# Patient Record
Sex: Female | Born: 1950
Health system: Southern US, Community
[De-identification: ages and names within clinical notes are randomized; demographics above are authoritative.]

## PROBLEM LIST (undated history)

## (undated) DIAGNOSIS — E7800 Pure hypercholesterolemia, unspecified: Secondary | ICD-10-CM

## (undated) DIAGNOSIS — R51 Headache: Secondary | ICD-10-CM

## (undated) DIAGNOSIS — K219 Gastro-esophageal reflux disease without esophagitis: Secondary | ICD-10-CM

## (undated) DIAGNOSIS — I729 Aneurysm of unspecified site: Secondary | ICD-10-CM

## (undated) DIAGNOSIS — G250 Essential tremor: Secondary | ICD-10-CM

## (undated) DIAGNOSIS — R251 Tremor, unspecified: Secondary | ICD-10-CM

## (undated) DIAGNOSIS — F419 Anxiety disorder, unspecified: Secondary | ICD-10-CM

## (undated) HISTORY — PX: EYE SURGERY: SHX253

## (undated) HISTORY — DX: Gastro-esophageal reflux disease without esophagitis: K21.9

## (undated) HISTORY — PX: ESOPHAGOGASTRODUODENOSCOPY ENDOSCOPY: SHX5814

## (undated) HISTORY — DX: Pure hypercholesterolemia, unspecified: E78.00

## (undated) HISTORY — DX: Essential tremor: G25.0

## (undated) HISTORY — DX: Tremor, unspecified: R25.1

---

## 1995-08-02 DIAGNOSIS — I729 Aneurysm of unspecified site: Secondary | ICD-10-CM

## 1995-08-02 HISTORY — DX: Aneurysm of unspecified site: I72.9

## 2003-08-02 HISTORY — PX: BREAST BIOPSY: SHX20

## 2004-08-01 HISTORY — PX: COLONOSCOPY: SHX174

## 2008-11-21 ENCOUNTER — Ambulatory Visit (HOSPITAL_COMMUNITY): Admission: RE | Admit: 2008-11-21 | Discharge: 2008-11-21 | Payer: Self-pay | Admitting: Family Medicine

## 2010-01-29 ENCOUNTER — Ambulatory Visit (HOSPITAL_COMMUNITY): Admission: RE | Admit: 2010-01-29 | Discharge: 2010-01-29 | Payer: Self-pay | Admitting: Family Medicine

## 2010-12-31 ENCOUNTER — Other Ambulatory Visit (HOSPITAL_COMMUNITY): Payer: Self-pay | Admitting: Family Medicine

## 2010-12-31 DIAGNOSIS — Z139 Encounter for screening, unspecified: Secondary | ICD-10-CM

## 2011-02-03 ENCOUNTER — Telehealth: Payer: Self-pay

## 2011-02-03 NOTE — Telephone Encounter (Signed)
Pt does not wish to schedule colonoscopy at this time. Said she will call back later. She wants to find a time when businesses will be closed but the doctors will be working at the hospital.  Faxing note back to Ruffin at [facility] that pt is going to put off a little while.   PT'S BROTHER HAS COLON CANCER!

## 2011-02-03 NOTE — Telephone Encounter (Signed)
Pt is aware she needs OV first, brother has colon cancer.

## 2011-02-04 ENCOUNTER — Ambulatory Visit (HOSPITAL_COMMUNITY)
Admission: RE | Admit: 2011-02-04 | Discharge: 2011-02-04 | Disposition: A | Discharge status: Home / Self Care | Payer: PRIVATE HEALTH INSURANCE | Source: Ambulatory Visit | Attending: Family Medicine | Admitting: Family Medicine

## 2011-02-04 DIAGNOSIS — Z139 Encounter for screening, unspecified: Secondary | ICD-10-CM

## 2011-02-04 DIAGNOSIS — Z1231 Encounter for screening mammogram for malignant neoplasm of breast: Secondary | ICD-10-CM | POA: Insufficient documentation

## 2011-02-04 NOTE — Telephone Encounter (Signed)
[facility] mobile appt is 02/25/2011 @ 10:00 AM. Selena Batten is aware. I will call pt on Mon and triage and mail prep.

## 2011-02-04 NOTE — Telephone Encounter (Signed)
Called pt. Schedule TCS Feb 24 999-MOVIPREP day before, NO SPLIT DOSING. Start clear liquids around 3 PM.

## 2011-02-04 NOTE — Telephone Encounter (Signed)
Pt had a bro w/ colon CA age 60. TCS 6 years ago.

## 2011-02-10 NOTE — Telephone Encounter (Signed)
Gastroenterology Pre-Procedure Form  Request Date: 02/25/2011     Requesting Physician: Dr. Regino Schultze     PATIENT INFORMATION:  Kaitlyn Glass is a 60 y.o., female (DOB=08-Nov-1950).  PROCEDURE: Procedure(s) requested: colonoscopy Procedure Reason: family history of colon cancer  PATIENT REVIEW QUESTIONS: The patient reports the following:   1. Diabetes Melitis: no 2. Joint replacements in the past 12 months: no 3. Major health problems in the past 3 months: no 4. Has an artificial valve or MVP:no 5. Has been advised in past to take antibiotics in advance of a procedure like teeth cleaning: no}    MEDICATIONS & ALLERGIES:    Patient reports the following regarding taking any blood thinners:   Plavix? no Aspirin?no Coumadin?  no  Patient confirms/reports the following medications:  Current Outpatient Prescriptions  Medication Sig Dispense Refill  . acetaminophen (TYLENOL) 500 MG tablet Take 500 mg by mouth every 6 (six) hours as needed. ONE TABLET DAILY       . calcium carbonate (OS-CAL) 600 MG TABS Take 600 mg by mouth daily.        . montelukast (SINGULAIR) 10 MG tablet Take 10 mg by mouth at bedtime.        . Multiple Vitamin (MULTIVITAMIN) tablet Take 1 tablet by mouth daily.        . simvastatin (ZOCOR) 10 MG tablet Take 10 mg by mouth at bedtime.          Patient confirms/reports the following allergies:  Allergies  Allergen Reactions  . Codeine     Nausea  . Penicillins     HIVES AND SWELLING OF THE THROAT    Patient is appropriate to schedule for requested procedure(s): yes  AUTHORIZATION INFORMATION Primary Insurance: ,  ID #: ,  Group #:  Pre-Cert / Auth required Pre-Cert / Auth #:  Secondary Insurance: ,  ID #: ,  Group #:  Pre-Cert / Auth required Pre-Cert / Auth #:  No orders of the defined types were placed in this encounter.    SCHEDULE INFORMATION: Procedure has been scheduled as follows:  Date: 02/25/2011, Time: 10:00 AM  Location: [facility] Short Stay  This Gastroenterology Pre-Precedure Form is being routed to the following provider(s) for review: Jonette Eva, MD  Kim aware of appt. Rx and instructions mailed to pt.

## 2011-02-10 NOTE — Telephone Encounter (Signed)
noted 

## 2011-02-10 NOTE — Telephone Encounter (Signed)
Rx and instructions mailed to pt.  

## 2011-02-10 NOTE — Telephone Encounter (Signed)
Opened in error

## 2011-02-24 MED ORDER — SODIUM CHLORIDE 0.45 % IV SOLN
Freq: Once | INTRAVENOUS | Status: AC
  Administered 2011-02-25: 10:00:00 via INTRAVENOUS

## 2011-02-25 ENCOUNTER — Encounter: Payer: PRIVATE HEALTH INSURANCE | Admitting: Gastroenterology

## 2011-02-25 ENCOUNTER — Other Ambulatory Visit: Payer: Self-pay | Admitting: Gastroenterology

## 2011-02-25 ENCOUNTER — Ambulatory Visit (HOSPITAL_COMMUNITY)
Admission: RE | Admit: 2011-02-25 | Discharge: 2011-02-25 | Disposition: A | Discharge status: Home / Self Care | Payer: PRIVATE HEALTH INSURANCE | Source: Ambulatory Visit | Attending: Gastroenterology | Admitting: Gastroenterology

## 2011-02-25 ENCOUNTER — Encounter (HOSPITAL_COMMUNITY)
Admission: RE | Disposition: A | Discharge status: Home / Self Care | Payer: Self-pay | Source: Ambulatory Visit | Attending: Gastroenterology

## 2011-02-25 ENCOUNTER — Encounter (HOSPITAL_COMMUNITY): Payer: Self-pay | Admitting: *Deleted

## 2011-02-25 DIAGNOSIS — D126 Benign neoplasm of colon, unspecified: Secondary | ICD-10-CM

## 2011-02-25 DIAGNOSIS — Z800 Family history of malignant neoplasm of digestive organs: Secondary | ICD-10-CM

## 2011-02-25 DIAGNOSIS — Z1211 Encounter for screening for malignant neoplasm of colon: Secondary | ICD-10-CM | POA: Insufficient documentation

## 2011-02-25 DIAGNOSIS — K648 Other hemorrhoids: Secondary | ICD-10-CM

## 2011-02-25 DIAGNOSIS — D128 Benign neoplasm of rectum: Secondary | ICD-10-CM | POA: Insufficient documentation

## 2011-02-25 HISTORY — DX: Headache: R51

## 2011-02-25 HISTORY — DX: Aneurysm of unspecified site: I72.9

## 2011-02-25 HISTORY — PX: COLONOSCOPY: SHX5424

## 2011-02-25 HISTORY — DX: Anxiety disorder, unspecified: F41.9

## 2011-02-25 SURGERY — COLONOSCOPY
Anesthesia: Moderate Sedation

## 2011-02-25 MED ORDER — MIDAZOLAM HCL 5 MG/5ML IJ SOLN
INTRAMUSCULAR | Status: DC | PRN
  Administered 2011-02-25: 2 mg via INTRAVENOUS
  Administered 2011-02-25: 1 mg via INTRAVENOUS

## 2011-02-25 MED ORDER — MEPERIDINE HCL 100 MG/ML IJ SOLN
INTRAMUSCULAR | Status: DC | PRN
  Administered 2011-02-25: 50 mg via INTRAVENOUS
  Administered 2011-02-25: 25 mg via INTRAVENOUS

## 2011-02-25 MED ORDER — MIDAZOLAM HCL 5 MG/5ML IJ SOLN
INTRAMUSCULAR | Status: AC
  Filled 2011-02-25: qty 10

## 2011-02-25 MED ORDER — MEPERIDINE HCL 100 MG/ML IJ SOLN
INTRAMUSCULAR | Status: AC
  Filled 2011-02-25: qty 2

## 2011-02-25 NOTE — H&P (Signed)
  Primary Care Physician:  Kirk Ruths, MD Primary Gastroenterologist:  Dr. Darrick Penna  Pre-Procedure History & Physical: HPI:  Kaitlyn Glass is a 60 y.o. female is here for a screening colonoscopy.   Past Medical History  Diagnosis Date  . Anxiety   . Headache   . Aneurysm 1997    no surgery    Past Surgical History  Procedure Date  . Colonoscopy 2006    Family Hx Colon Cancer - Brother  . Breast biopsy 2005    negative for CA    Prior to Admission medications   Medication Sig Start Date End Date Taking? Authorizing Provider  Multiple Vitamin (MULTIVITAMIN) tablet Take 1 tablet by mouth daily.     Yes Historical Provider, MD  acetaminophen (TYLENOL) 500 MG tablet Take 500 mg by mouth every 6 (six) hours as needed. ONE TABLET DAILY     Historical Provider, MD  calcium carbonate (OS-CAL) 600 MG TABS Take 600 mg by mouth daily.      Historical Provider, MD  montelukast (SINGULAIR) 10 MG tablet Take 10 mg by mouth at bedtime.      Historical Provider, MD  simvastatin (ZOCOR) 10 MG tablet Take 10 mg by mouth at bedtime.      Historical Provider, MD    Allergies as of 02/04/2011  . (Not on File)    Family History  Problem Relation Age of Onset  . Colon cancer Brother > 60    History   Social History  . Marital Status: Married    Spouse Name: N/A    Number of Children: N/A  . Years of Education: N/A   Occupational History  . Not on file.   Social History Main Topics  . Smoking status: Never Smoker   . Smokeless tobacco: Not on file  . Alcohol Use: No  . Drug Use: No  . Sexually Active:    Other Topics Concern  . Not on file   Social History Narrative  . No narrative on file    Review of Systems: See HPI, otherwise negative ROS  Physical Exam: BP 130/76  Pulse 71  Temp 98.2 F (36.8 C)  Resp 16  Ht 5\' 3"  (1.6 m)  Wt 145 lb (65.772 kg)  BMI 25.69 kg/m2  SpO2 98% General:   Alert,  pleasant and cooperative in NAD Head:  Normocephalic and  atraumatic. Neck:  Supple; no masses or thyromegaly. Lungs:  Clear throughout to auscultation.    Heart:  Regular rate and rhythm. Abdomen:  Soft, nontender and nondistended. Normal bowel sounds, without guarding, and without rebound.   Neurologic:  Alert and  oriented x4;  grossly normal neurologically.  Impression/Plan: Kaitlyn Glass is now here to undergo a screening colonoscopy.  Risks, benefits, limitations, and alternatives regarding colonoscopy have been reviewed with the patient. Questions have been answered.

## 2011-03-01 ENCOUNTER — Telehealth: Payer: Self-pay | Admitting: Gastroenterology

## 2011-03-01 NOTE — Telephone Encounter (Signed)
Cc path to PCP 

## 2011-03-01 NOTE — Telephone Encounter (Signed)
Reminder in epic to repeat tcs in 5-10 yrs

## 2011-03-01 NOTE — Telephone Encounter (Signed)
Please call pt. She had HYPERPLASTIC POLYP removed from her colon. TCS in 5-10 years. High fiber diet.

## 2011-03-02 NOTE — Telephone Encounter (Signed)
Pt informed

## 2011-03-04 ENCOUNTER — Encounter (HOSPITAL_COMMUNITY): Payer: Self-pay | Admitting: Gastroenterology

## 2011-03-18 ENCOUNTER — Other Ambulatory Visit: Payer: Self-pay | Admitting: Obstetrics & Gynecology

## 2011-03-18 ENCOUNTER — Other Ambulatory Visit (HOSPITAL_COMMUNITY)
Admission: RE | Admit: 2011-03-18 | Discharge: 2011-03-18 | Disposition: A | Discharge status: Home / Self Care | Payer: PRIVATE HEALTH INSURANCE | Source: Ambulatory Visit | Attending: Obstetrics & Gynecology | Admitting: Obstetrics & Gynecology

## 2011-03-18 DIAGNOSIS — Z01419 Encounter for gynecological examination (general) (routine) without abnormal findings: Secondary | ICD-10-CM | POA: Insufficient documentation

## 2012-02-07 ENCOUNTER — Other Ambulatory Visit: Payer: Self-pay | Admitting: Obstetrics & Gynecology

## 2012-02-07 DIAGNOSIS — Z139 Encounter for screening, unspecified: Secondary | ICD-10-CM

## 2012-02-17 ENCOUNTER — Ambulatory Visit (HOSPITAL_COMMUNITY)
Admission: RE | Admit: 2012-02-17 | Discharge: 2012-02-17 | Disposition: A | Discharge status: Home / Self Care | Payer: PRIVATE HEALTH INSURANCE | Source: Ambulatory Visit | Attending: Family Medicine | Admitting: Family Medicine

## 2012-02-17 ENCOUNTER — Ambulatory Visit (HOSPITAL_COMMUNITY): Payer: PRIVATE HEALTH INSURANCE

## 2012-02-17 ENCOUNTER — Other Ambulatory Visit (HOSPITAL_COMMUNITY): Payer: Self-pay | Admitting: Family Medicine

## 2012-02-17 ENCOUNTER — Ambulatory Visit (HOSPITAL_COMMUNITY)
Admission: RE | Admit: 2012-02-17 | Discharge: 2012-02-17 | Disposition: A | Discharge status: Home / Self Care | Payer: PRIVATE HEALTH INSURANCE | Source: Ambulatory Visit | Attending: Obstetrics & Gynecology | Admitting: Obstetrics & Gynecology

## 2012-02-17 DIAGNOSIS — Z1231 Encounter for screening mammogram for malignant neoplasm of breast: Secondary | ICD-10-CM | POA: Insufficient documentation

## 2012-02-17 DIAGNOSIS — Z01419 Encounter for gynecological examination (general) (routine) without abnormal findings: Secondary | ICD-10-CM | POA: Insufficient documentation

## 2012-02-17 DIAGNOSIS — Z139 Encounter for screening, unspecified: Secondary | ICD-10-CM

## 2012-02-17 DIAGNOSIS — R05 Cough: Secondary | ICD-10-CM | POA: Insufficient documentation

## 2012-02-17 DIAGNOSIS — R059 Cough, unspecified: Secondary | ICD-10-CM | POA: Insufficient documentation

## 2012-03-23 ENCOUNTER — Other Ambulatory Visit (HOSPITAL_COMMUNITY)
Admission: RE | Admit: 2012-03-23 | Discharge: 2012-03-23 | Disposition: A | Discharge status: Home / Self Care | Payer: PRIVATE HEALTH INSURANCE | Source: Ambulatory Visit | Attending: Obstetrics & Gynecology | Admitting: Obstetrics & Gynecology

## 2012-03-23 ENCOUNTER — Other Ambulatory Visit: Payer: Self-pay | Admitting: Obstetrics & Gynecology

## 2012-03-23 DIAGNOSIS — Z01419 Encounter for gynecological examination (general) (routine) without abnormal findings: Secondary | ICD-10-CM | POA: Insufficient documentation

## 2013-01-14 ENCOUNTER — Other Ambulatory Visit (HOSPITAL_COMMUNITY): Payer: Self-pay | Admitting: Family Medicine

## 2013-01-14 DIAGNOSIS — Z139 Encounter for screening, unspecified: Secondary | ICD-10-CM

## 2013-02-22 ENCOUNTER — Ambulatory Visit (HOSPITAL_COMMUNITY)
Admission: RE | Admit: 2013-02-22 | Discharge: 2013-02-22 | Disposition: A | Discharge status: Home / Self Care | Payer: PRIVATE HEALTH INSURANCE | Source: Ambulatory Visit | Attending: Family Medicine | Admitting: Family Medicine

## 2013-02-22 DIAGNOSIS — Z139 Encounter for screening, unspecified: Secondary | ICD-10-CM

## 2013-02-22 DIAGNOSIS — Z1231 Encounter for screening mammogram for malignant neoplasm of breast: Secondary | ICD-10-CM | POA: Insufficient documentation

## 2013-03-29 ENCOUNTER — Other Ambulatory Visit (HOSPITAL_COMMUNITY)
Admission: RE | Admit: 2013-03-29 | Discharge: 2013-03-29 | Disposition: A | Discharge status: Home / Self Care | Payer: PRIVATE HEALTH INSURANCE | Source: Ambulatory Visit | Attending: Obstetrics & Gynecology | Admitting: Obstetrics & Gynecology

## 2013-03-29 ENCOUNTER — Ambulatory Visit (INDEPENDENT_AMBULATORY_CARE_PROVIDER_SITE_OTHER): Payer: PRIVATE HEALTH INSURANCE | Admitting: Obstetrics & Gynecology

## 2013-03-29 ENCOUNTER — Encounter: Payer: Self-pay | Admitting: Obstetrics & Gynecology

## 2013-03-29 VITALS — BP 140/70 | Ht 62.0 in | Wt 170.0 lb

## 2013-03-29 DIAGNOSIS — I471 Supraventricular tachycardia: Secondary | ICD-10-CM

## 2013-03-29 DIAGNOSIS — Z01419 Encounter for gynecological examination (general) (routine) without abnormal findings: Secondary | ICD-10-CM | POA: Insufficient documentation

## 2013-03-29 DIAGNOSIS — Z1151 Encounter for screening for human papillomavirus (HPV): Secondary | ICD-10-CM | POA: Insufficient documentation

## 2013-03-29 DIAGNOSIS — Z1212 Encounter for screening for malignant neoplasm of rectum: Secondary | ICD-10-CM

## 2013-03-29 DIAGNOSIS — G250 Essential tremor: Secondary | ICD-10-CM

## 2013-03-29 DIAGNOSIS — E781 Pure hyperglyceridemia: Secondary | ICD-10-CM

## 2013-03-29 NOTE — Patient Instructions (Addendum)
Tremor  Tremor is a rhythmic, involuntary muscular contraction characterized by oscillations (to-and-fro movements) of a part of the body. The most common of all involuntary movements, tremor can affect various body parts such as the hands, head, facial structures, vocal cords, trunk, and legs; most tremors, however, occur in the hands. Tremor often accompanies neurological disorders associated with aging. Although the disorder is not life-threatening, it can be responsible for functional disability and social embarrassment.  TREATMENT   There are many types of tremor and several ways in which tremor is classified. The most common classification is by behavioral context or position. There are five categories of tremor within this classification: resting, postural, kinetic, task-specific, and psychogenic. Resting or static tremor occurs when the muscle is at rest, for example when the hands are lying on the lap. This type of tremor is often seen in patients with Parkinson's disease. Postural tremor occurs when a patient attempts to maintain posture, such as holding the hands outstretched. Postural tremors include physiological tremor, essential tremor, tremor with basal ganglia disease (also seen in patients with Parkinson's disease), cerebellar postural tremor, tremor with peripheral neuropathy, post-traumatic tremor, and alcoholic tremor. Kinetic or intention (action) tremor occurs during purposeful movement, for example during finger-to-nose testing. Task-specific tremor appears when performing goal-oriented tasks such as handwriting, speaking, or standing. This group consists of primary writing tremor, vocal tremor, and orthostatic tremor. Psychogenic tremor occurs in both older and younger patients. The key feature of this tremor is that it dramatically lessens or disappears when the patient is distracted.  PROGNOSIS  There are some treatment options available for tremor; the appropriate treatment depends on  accurate diagnosis of the cause. Some tremors respond to treatment of the underlying condition, for example in some cases of psychogenic tremor treating the patient's underlying mental problem may cause the tremor to disappear. Also, patients with tremor due to Parkinson's disease may be treated with Levodopa drug therapy. Symptomatic drug therapy is available for several other tremors as well. For those cases of tremor in which there is no effective drug treatment, physical measures such as teaching the patient to brace the affected limb during the tremor are sometimes useful. Surgical intervention such as thalamotomy or deep brain stimulation may be useful in certain cases.  Document Released: 07/08/2002 Document Revised: 10/10/2011 Document Reviewed: 07/18/2005  Exit[employer]® Patient Information ©2014 [employer].

## 2013-03-29 NOTE — Progress Notes (Signed)
Patient ID: Kaitlyn Glass, female   DOB: [birth year], 62 y.o.   MRN: [id number] Subjective:     Kaitlyn Glass is a 62 y.o. female here for a routine exam.  No LMP recorded. Patient is postmenopausal. No obstetric history on file. Current complaints: none  Personal health questionnaire reviewed: no.   Gynecologic History No LMP recorded. Patient is postmenopausal. Contraception: post menopausal Last Pap: 2013. Results were: normal Last mammogram: 2014. Results were: normal  Obstetric History OB History  No data available     Past Medical History  Diagnosis Date  . Anxiety   . Headache(784.0)   . Aneurysm 1997    no surgery  . Benign familial tremor    Past Surgical History  Procedure Laterality Date  . Colonoscopy  2006    Family Hx Colon Cancer - Brother  . Breast biopsy  2005    negative for CA  . Colonoscopy  02/25/2011    Procedure: COLONOSCOPY;  Surgeon: Arlyce Harman, MD;  Location: [facility];  Service: Endoscopy;  Laterality: N/A;  . Eye surgery       Review of Systems  Review of Systems  Constitutional: Negative for fever, chills, weight loss, malaise/fatigue and diaphoresis.  HENT: Negative for hearing loss, ear pain, nosebleeds, congestion, sore throat, neck pain, tinnitus and ear discharge.   Eyes: Negative for blurred vision, double vision, photophobia, pain, discharge and redness.  Respiratory: Negative for cough, hemoptysis, sputum production, shortness of breath, wheezing and stridor.   Cardiovascular: Negative for chest pain, palpitations, orthopnea, claudication, leg swelling and PND.  Gastrointestinal: negative for abdominal pain. Negative for heartburn, nausea, vomiting, diarrhea, constipation, blood in stool and melena.  Genitourinary: Negative for dysuria, urgency, frequency, hematuria and flank pain.  Musculoskeletal: Negative for myalgias, back pain, joint pain and falls.  Skin: Negative for itching and rash.  Neurological: Negative for dizziness,  tingling, tremors, sensory change, speech change, focal weakness, seizures, loss of consciousness, weakness and headaches.  Endo/Heme/Allergies: Negative for environmental allergies and polydipsia. Does not bruise/bleed easily.  Psychiatric/Behavioral: Negative for depression, suicidal ideas, hallucinations, memory loss and substance abuse. The patient is not nervous/anxious and does not have insomnia.        Objective:    Physical Exam  Vitals reviewed. Constitutional: She is oriented to person, place, and time. She appears well-developed and well-nourished.  HENT:  Head: Normocephalic and atraumatic.        Right Ear: External ear normal.  Left Ear: External ear normal.  Nose: Nose normal.  Mouth/Throat: Oropharynx is clear and moist.  Eyes: Conjunctivae and EOM are normal. Pupils are equal, round, and reactive to light. Right eye exhibits no discharge. Left eye exhibits no discharge. No scleral icterus.  Neck: Normal range of motion. Neck supple. No tracheal deviation present. No thyromegaly present.  Cardiovascular: Normal rate, regular rhythm, normal heart sounds and intact distal pulses.  Exam reveals no gallop and no friction rub.   No murmur heard. Respiratory: Effort normal and breath sounds normal. No respiratory distress. She has no wheezes. She has no rales. She exhibits no tenderness.  GI: Soft. Bowel sounds are normal. She exhibits no distension and no mass. There is no tenderness. There is no rebound and no guarding.  Genitourinary:       Vulva is normal without lesions Vagina is pink moist without discharge Cervix normal in appearance and pap is done Uterus is normal size shape and contour Adnexa is negative with normal sized ovaries  Rectal heme negative no masses normal tone Musculoskeletal: Normal range of motion. She exhibits no edema and no tenderness.  Neurological: She is alert and oriented to person, place, and time. She has normal reflexes. She displays normal  reflexes. No cranial nerve deficit. She exhibits normal muscle tone. Coordination normal.  Skin: Skin is warm and dry. No rash noted. No erythema. No pallor.  Psychiatric: She has a normal mood and affect. Her behavior is normal. Judgment and thought content normal.       Assessment:    Healthy female exam.    Plan:  Pap done folow up 1 year

## 2014-02-11 ENCOUNTER — Other Ambulatory Visit: Payer: Self-pay | Admitting: Obstetrics & Gynecology

## 2014-02-11 DIAGNOSIS — Z1231 Encounter for screening mammogram for malignant neoplasm of breast: Secondary | ICD-10-CM

## 2014-02-28 ENCOUNTER — Ambulatory Visit (HOSPITAL_COMMUNITY): Payer: PRIVATE HEALTH INSURANCE

## 2014-03-07 ENCOUNTER — Ambulatory Visit (HOSPITAL_COMMUNITY): Payer: PRIVATE HEALTH INSURANCE

## 2014-03-14 ENCOUNTER — Ambulatory Visit (HOSPITAL_COMMUNITY)
Admission: RE | Admit: 2014-03-14 | Discharge: 2014-03-14 | Disposition: A | Discharge status: Home / Self Care | Payer: PRIVATE HEALTH INSURANCE | Source: Ambulatory Visit | Attending: Obstetrics & Gynecology | Admitting: Obstetrics & Gynecology

## 2014-03-14 DIAGNOSIS — Z1231 Encounter for screening mammogram for malignant neoplasm of breast: Secondary | ICD-10-CM

## 2014-03-26 ENCOUNTER — Telehealth: Payer: Self-pay | Admitting: Obstetrics & Gynecology

## 2014-03-26 NOTE — Telephone Encounter (Signed)
Kaitlyn Glass spoke with pt letting her know mammogram was normal. JSY

## 2014-04-18 ENCOUNTER — Ambulatory Visit (INDEPENDENT_AMBULATORY_CARE_PROVIDER_SITE_OTHER): Payer: PRIVATE HEALTH INSURANCE | Admitting: Obstetrics & Gynecology

## 2014-04-18 ENCOUNTER — Other Ambulatory Visit (HOSPITAL_COMMUNITY)
Admission: RE | Admit: 2014-04-18 | Discharge: 2014-04-18 | Disposition: A | Discharge status: Home / Self Care | Payer: PRIVATE HEALTH INSURANCE | Source: Ambulatory Visit | Attending: Obstetrics & Gynecology | Admitting: Obstetrics & Gynecology

## 2014-04-18 ENCOUNTER — Encounter: Payer: Self-pay | Admitting: Obstetrics & Gynecology

## 2014-04-18 VITALS — BP 148/80 | Ht 63.0 in | Wt 173.0 lb

## 2014-04-18 DIAGNOSIS — Z01419 Encounter for gynecological examination (general) (routine) without abnormal findings: Secondary | ICD-10-CM | POA: Diagnosis present

## 2014-04-18 NOTE — Progress Notes (Signed)
Patient ID: Kaitlyn Glass, female   DOB: July 14, 1951, 64 y.o.   MRN: [id number] Subjective:     Kaitlyn Glass is a 63 y.o. female here for a routine exam.  No LMP recorded. Patient is postmenopausal. No obstetric history on file. Birth Control Method:  Post menopausal Menstrual Calendar(currently): na  Current complaints: none.   Current acute medical issues:  na   Recent Gynecologic History No LMP recorded. Patient is postmenopausal. Last Pap: 2014,  normal Last mammogram:2015,  normal  Past Medical History  Diagnosis Date  . Anxiety   . Headache(784.0)   . Aneurysm 1997    no surgery  . Benign familial tremor     Past Surgical History  Procedure Laterality Date  . Colonoscopy  2006    Family Hx Colon Cancer - Brother  . Breast biopsy  2005    negative for CA  . Colonoscopy  02/25/2011    Procedure: COLONOSCOPY;  Surgeon: Dorothyann Peng, MD;  Location: [facility];  Service: Endoscopy;  Laterality: N/A;  . Eye surgery      OB History   Grav Para Term Preterm Abortions TAB SAB Ect Mult Living                  History   Social History  . Marital Status: Married    Spouse Name: N/A    Number of Children: N/A  . Years of Education: N/A   Social History Main Topics  . Smoking status: Never Smoker   . Smokeless tobacco: Never Used  . Alcohol Use: No  . Drug Use: No  . Sexual Activity: Yes   Other Topics Concern  . None   Social History Narrative  . None    Family History  Problem Relation Age of Onset  . Colon cancer Brother     age > 18  . Breast cancer Sister   . Throat cancer Brother      Review of Systems  Review of Systems  Constitutional: Negative for fever, chills, weight loss, malaise/fatigue and diaphoresis.  HENT: Negative for hearing loss, ear pain, nosebleeds, congestion, sore throat, neck pain, tinnitus and ear discharge.   Eyes: Negative for blurred vision, double vision, photophobia, pain, discharge and redness.  Respiratory: Negative  for cough, hemoptysis, sputum production, shortness of breath, wheezing and stridor.   Cardiovascular: Negative for chest pain, palpitations, orthopnea, claudication, leg swelling and PND.  Gastrointestinal: negative for abdominal pain. Negative for heartburn, nausea, vomiting, diarrhea, constipation, blood in stool and melena.  Genitourinary: Negative for dysuria, urgency, frequency, hematuria and flank pain.  Musculoskeletal: Negative for myalgias, back pain, joint pain and falls.  Skin: Negative for itching and rash.  Neurological: Negative for dizziness, tingling, tremors, sensory change, speech change, focal weakness, seizures, loss of consciousness, weakness and headaches.  Endo/Heme/Allergies: Negative for environmental allergies and polydipsia. Does not bruise/bleed easily.  Psychiatric/Behavioral: Negative for depression, suicidal ideas, hallucinations, memory loss and substance abuse. The patient is not nervous/anxious and does not have insomnia.        Objective:    Physical Exam  Vitals reviewed. Constitutional: She is oriented to person, place, and time. She appears well-developed and well-nourished.  HENT:  Head: Normocephalic and atraumatic.        Right Ear: External ear normal.  Left Ear: External ear normal.  Nose: Nose normal.  Mouth/Throat: Oropharynx is clear and moist.  Eyes: Conjunctivae and EOM are normal. Pupils are equal, round, and reactive to light.  Right eye exhibits no discharge. Left eye exhibits no discharge. No scleral icterus.  Neck: Normal range of motion. Neck supple. No tracheal deviation present. No thyromegaly present.  Cardiovascular: Normal rate, regular rhythm, normal heart sounds and intact distal pulses.  Exam reveals no gallop and no friction rub.   No murmur heard. Respiratory: Effort normal and breath sounds normal. No respiratory distress. She has no wheezes. She has no rales. She exhibits no tenderness.  GI: Soft. Bowel sounds are normal.  She exhibits no distension and no mass. There is no tenderness. There is no rebound and no guarding.  Genitourinary:  Breasts no masses skin changes or nipple changes bilaterally      Vulva is normal without lesions Vagina is pink moist without discharge Cervix normal in appearance and pap is done Uterus is normal size shape and contour Adnexa is negative with normal sized ovaries  Rectal    hemoccult negative, normal tone, no masses  Musculoskeletal: Normal range of motion. She exhibits no edema and no tenderness.  Neurological: She is alert and oriented to person, place, and time. She has normal reflexes. She displays normal reflexes. No cranial nerve deficit. She exhibits normal muscle tone. Coordination normal.  Skin: Skin is warm and dry. No rash noted. No erythema. No pallor.  Psychiatric: She has a normal mood and affect. Her behavior is normal. Judgment and thought content normal.       Assessment:    Healthy female exam.    Plan:    Follow up in: 1 year.   CoQ10 200 ng a day Red Krill Oil 1500 BID

## 2014-04-21 LAB — CYTOLOGY - PAP

## 2015-02-09 ENCOUNTER — Other Ambulatory Visit (HOSPITAL_COMMUNITY): Payer: Self-pay | Admitting: Internal Medicine

## 2015-02-09 DIAGNOSIS — Z1231 Encounter for screening mammogram for malignant neoplasm of breast: Secondary | ICD-10-CM

## 2015-03-20 ENCOUNTER — Ambulatory Visit (HOSPITAL_COMMUNITY)
Admission: RE | Admit: 2015-03-20 | Discharge: 2015-03-20 | Disposition: A | Discharge status: Home / Self Care | Payer: PRIVATE HEALTH INSURANCE | Source: Ambulatory Visit | Attending: Internal Medicine | Admitting: Internal Medicine

## 2015-03-20 DIAGNOSIS — Z1231 Encounter for screening mammogram for malignant neoplasm of breast: Secondary | ICD-10-CM | POA: Diagnosis not present

## 2015-04-22 ENCOUNTER — Other Ambulatory Visit (HOSPITAL_COMMUNITY)
Admission: RE | Admit: 2015-04-22 | Discharge: 2015-04-22 | Disposition: A | Discharge status: Home / Self Care | Payer: PRIVATE HEALTH INSURANCE | Source: Ambulatory Visit | Attending: Obstetrics & Gynecology | Admitting: Obstetrics & Gynecology

## 2015-04-22 ENCOUNTER — Other Ambulatory Visit: Payer: PRIVATE HEALTH INSURANCE | Admitting: Obstetrics & Gynecology

## 2015-04-22 ENCOUNTER — Encounter: Payer: Self-pay | Admitting: Obstetrics & Gynecology

## 2015-04-22 ENCOUNTER — Ambulatory Visit (INDEPENDENT_AMBULATORY_CARE_PROVIDER_SITE_OTHER): Payer: PRIVATE HEALTH INSURANCE | Admitting: Obstetrics & Gynecology

## 2015-04-22 VITALS — BP 120/80 | HR 80 | Wt 171.0 lb

## 2015-04-22 DIAGNOSIS — Z1212 Encounter for screening for malignant neoplasm of rectum: Secondary | ICD-10-CM

## 2015-04-22 DIAGNOSIS — Z01419 Encounter for gynecological examination (general) (routine) without abnormal findings: Secondary | ICD-10-CM

## 2015-04-22 DIAGNOSIS — Z1211 Encounter for screening for malignant neoplasm of colon: Secondary | ICD-10-CM

## 2015-04-22 NOTE — Progress Notes (Signed)
Patient ID: Kaitlyn Glass, female   DOB: Feb 06, 1951, 64 y.o.   MRN: [id number] Subjective:     Kaitlyn Glass is a 64 y.o. female here for a routine exam.  No LMP recorded. Patient is postmenopausal. No obstetric history on file. Birth Control Method:  none Menstrual Calendar(currently): post menopausal  Current complaints: none.   Current acute medical issues:  None, encouraged to take her nexium   Recent Gynecologic History No LMP recorded. Patient is postmenopausal. Last Pap: 2015,  normal Last mammogram: 8/16,  normal  Past Medical History  Diagnosis Date  . Anxiety   . Headache(784.0)   . Aneurysm 1997    no surgery  . Benign familial tremor     Past Surgical History  Procedure Laterality Date  . Colonoscopy  2006    Family Hx Colon Cancer - Brother  . Breast biopsy  2005    negative for CA  . Colonoscopy  02/25/2011    Procedure: COLONOSCOPY;  Surgeon: Dorothyann Peng, MD;  Location: [facility];  Service: Endoscopy;  Laterality: N/A;  . Eye surgery      OB History    No data available      Social History   Social History  . Marital Status: Married    Spouse Name: N/A  . Number of Children: N/A  . Years of Education: N/A   Social History Main Topics  . Smoking status: Never Smoker   . Smokeless tobacco: Never Used  . Alcohol Use: No  . Drug Use: No  . Sexual Activity: Yes   Other Topics Concern  . None   Social History Narrative    Family History  Problem Relation Age of Onset  . Colon cancer Brother     age > 71  . Breast cancer Sister   . Throat cancer Brother      Current outpatient prescriptions:  .  calcium carbonate (OS-CAL) 600 MG TABS, Take 600 mg by mouth daily.  , Disp: , Rfl:  .  simvastatin (ZOCOR) 10 MG tablet, Take 10 mg by mouth at bedtime.  , Disp: , Rfl:  .  acetaminophen (TYLENOL) 500 MG tablet, Take 500 mg by mouth every 6 (six) hours as needed. ONE TABLET DAILY , Disp: , Rfl:  .  ALPRAZolam (XANAX) 0.5 MG tablet, Take 0.5  mg by mouth at bedtime as needed. , Disp: , Rfl:  .  aspirin EC 81 MG tablet, Take 81 mg by mouth daily. , Disp: , Rfl:  .  esomeprazole (NEXIUM) 40 MG capsule, Take 40 mg by mouth daily. , Disp: , Rfl:  .  montelukast (SINGULAIR) 10 MG tablet, Take 10 mg by mouth at bedtime.  , Disp: , Rfl:  .  Multiple Vitamin (MULTIVITAMIN) tablet, Take 1 tablet by mouth daily.  , Disp: , Rfl:  .  propranolol (INDERAL) 10 MG tablet, Take 10 mg by mouth 3 (three) times daily., Disp: , Rfl:   Review of Systems  Review of Systems  Constitutional: Negative for fever, chills, weight loss, malaise/fatigue and diaphoresis.  HENT: Negative for hearing loss, ear pain, nosebleeds, congestion, sore throat, neck pain, tinnitus and ear discharge.   Eyes: Negative for blurred vision, double vision, photophobia, pain, discharge and redness.  Respiratory: Negative for cough, hemoptysis, sputum production, shortness of breath, wheezing and stridor.   Cardiovascular: Negative for chest pain, palpitations, orthopnea, claudication, leg swelling and PND.  Gastrointestinal: negative for abdominal pain. Negative for heartburn, nausea, vomiting, diarrhea,  constipation, blood in stool and melena.  Genitourinary: Negative for dysuria, urgency, frequency, hematuria and flank pain.  Musculoskeletal: Negative for myalgias, back pain, joint pain and falls.  Skin: Negative for itching and rash.  Neurological: Negative for dizziness, tingling, tremors, sensory change, speech change, focal weakness, seizures, loss of consciousness, weakness and headaches.  Endo/Heme/Allergies: Negative for environmental allergies and polydipsia. Does not bruise/bleed easily.  Psychiatric/Behavioral: Negative for depression, suicidal ideas, hallucinations, memory loss and substance abuse. The patient is not nervous/anxious and does not have insomnia.        Objective:  Blood pressure 120/80, pulse 80, weight 171 lb (77.565 kg).   Physical Exam  Vitals  reviewed. Constitutional: She is oriented to person, place, and time. She appears well-developed and well-nourished.  HENT:  Head: Normocephalic and atraumatic.        Right Ear: External ear normal.  Left Ear: External ear normal.  Nose: Nose normal.  Mouth/Throat: Oropharynx is clear and moist.  Eyes: Conjunctivae and EOM are normal. Pupils are equal, round, and reactive to light. Right eye exhibits no discharge. Left eye exhibits no discharge. No scleral icterus.  Neck: Normal range of motion. Neck supple. No tracheal deviation present. No thyromegaly present.  Cardiovascular: Normal rate, regular rhythm, normal heart sounds and intact distal pulses.  Exam reveals no gallop and no friction rub.   No murmur heard. Respiratory: Effort normal and breath sounds normal. No respiratory distress. She has no wheezes. She has no rales. She exhibits no tenderness.  GI: Soft. Bowel sounds are normal. She exhibits no distension and no mass. There is no tenderness. There is no rebound and no guarding.  Genitourinary:  Breasts no masses skin changes or nipple changes bilaterally      Vulva is normal without lesions Vagina is pink moist without discharge Cervix normal in appearance and pap is done Uterus is normal size shape and contour Adnexa is negative  {Rectal    hemoccult negative, normal tone, no masses  Musculoskeletal: Normal range of motion. She exhibits no edema and no tenderness.  Neurological: She is alert and oriented to person, place, and time. She has normal reflexes. She displays normal reflexes. No cranial nerve deficit. She exhibits normal muscle tone. Coordination normal.  Skin: Skin is warm and dry. No rash noted. No erythema. No pallor.  Psychiatric: She has a normal mood and affect. Her behavior is normal. Judgment and thought content normal.       Assessment:    Healthy female exam.    Plan:    Mammogram ordered. Follow up in: 1 year.

## 2015-04-24 ENCOUNTER — Other Ambulatory Visit: Payer: PRIVATE HEALTH INSURANCE | Admitting: Obstetrics & Gynecology

## 2015-04-28 LAB — CYTOLOGY - PAP

## 2016-01-07 ENCOUNTER — Encounter: Payer: Self-pay | Admitting: Gastroenterology

## 2016-01-12 ENCOUNTER — Other Ambulatory Visit: Payer: Self-pay | Admitting: Obstetrics & Gynecology

## 2016-01-12 DIAGNOSIS — Z1231 Encounter for screening mammogram for malignant neoplasm of breast: Secondary | ICD-10-CM

## 2016-03-21 ENCOUNTER — Ambulatory Visit (HOSPITAL_COMMUNITY)
Admission: RE | Admit: 2016-03-21 | Discharge: 2016-03-21 | Disposition: A | Discharge status: Home / Self Care | Payer: 59 | Source: Ambulatory Visit | Attending: Obstetrics & Gynecology | Admitting: Obstetrics & Gynecology

## 2016-03-21 DIAGNOSIS — Z1231 Encounter for screening mammogram for malignant neoplasm of breast: Secondary | ICD-10-CM | POA: Insufficient documentation

## 2016-04-26 ENCOUNTER — Encounter: Payer: Self-pay | Admitting: Obstetrics & Gynecology

## 2016-04-26 ENCOUNTER — Encounter (INDEPENDENT_AMBULATORY_CARE_PROVIDER_SITE_OTHER): Payer: Self-pay

## 2016-04-26 ENCOUNTER — Ambulatory Visit (INDEPENDENT_AMBULATORY_CARE_PROVIDER_SITE_OTHER): Payer: 59 | Admitting: Obstetrics & Gynecology

## 2016-04-26 VITALS — BP 130/80 | HR 70 | Wt 174.0 lb

## 2016-04-26 DIAGNOSIS — Z01419 Encounter for gynecological examination (general) (routine) without abnormal findings: Secondary | ICD-10-CM | POA: Diagnosis not present

## 2016-04-26 DIAGNOSIS — Z1212 Encounter for screening for malignant neoplasm of rectum: Secondary | ICD-10-CM

## 2016-04-26 DIAGNOSIS — Z1211 Encounter for screening for malignant neoplasm of colon: Secondary | ICD-10-CM | POA: Diagnosis not present

## 2016-04-26 NOTE — Progress Notes (Signed)
Subjective:     Kaitlyn Glass is a 65 y.o. female here for a routine exam.  No LMP recorded. Patient is postmenopausal. No obstetric history on file. Birth Control Method:  menopausal Menstrual Calendar(currently): amenorrhiec  Current complaints: none.   Current acute medical issues:  none   Recent Gynecologic History No LMP recorded. Patient is postmenopausal. Last Pap: 2016,  normal Last mammogram: 2017,  normal  Past Medical History:  Diagnosis Date  . Aneurysm ([place]) 1997   no surgery  . Anxiety   . Benign familial tremor   . [id number]     Past Surgical History:  Procedure Laterality Date  . BREAST BIOPSY  2005   negative for CA  . COLONOSCOPY  2006   Family Hx Colon Cancer - Brother  . COLONOSCOPY  02/25/2011   Procedure: COLONOSCOPY;  Surgeon: Dorothyann Peng, MD;  Location: [facility];  Service: Endoscopy;  Laterality: N/A;  . EYE SURGERY      OB History    No data available      Social History   Social History  . Marital status: Married    Spouse name: N/A  . Number of children: N/A  . Years of education: N/A   Social History Main Topics  . Smoking status: Never Smoker  . Smokeless tobacco: Never Used  . Alcohol use No  . Drug use: No  . Sexual activity: Yes   Other Topics Concern  . None   Social History Narrative  . None    Family History  Problem Relation Age of Onset  . Colon cancer Brother     age > 85  . Breast cancer Sister   . Throat cancer Brother      Current Outpatient Prescriptions:  .  acetaminophen (TYLENOL) 500 MG tablet, Take 500 mg by mouth every 6 (six) hours as needed. ONE TABLET DAILY , Disp: , Rfl:  .  calcium carbonate (OS-CAL) 600 MG TABS, Take 600 mg by mouth daily.  , Disp: , Rfl:  .  co-enzyme Q-10 30 MG capsule, Take 100 mg by mouth daily., Disp: , Rfl:  .  esomeprazole (NEXIUM) 40 MG capsule, Take 40 mg by mouth daily. , Disp: , Rfl:  .  Multiple Vitamin (MULTIVITAMIN) tablet, Take 1 tablet by mouth  daily.  , Disp: , Rfl:  .  propranolol (INDERAL) 10 MG tablet, Take 10 mg by mouth daily. , Disp: , Rfl:  .  simvastatin (ZOCOR) 10 MG tablet, Take 10 mg by mouth at bedtime.  , Disp: , Rfl:  .  ALPRAZolam (XANAX) 0.5 MG tablet, Take 0.5 mg by mouth at bedtime as needed. , Disp: , Rfl:  .  aspirin EC 81 MG tablet, Take 81 mg by mouth daily. , Disp: , Rfl:  .  montelukast (SINGULAIR) 10 MG tablet, Take 10 mg by mouth at bedtime.  , Disp: , Rfl:   Review of Systems  Review of Systems  Constitutional: Negative for fever, chills, weight loss, malaise/fatigue and diaphoresis.  HENT: Negative for hearing loss, ear pain, nosebleeds, congestion, sore throat, neck pain, tinnitus and ear discharge.   Eyes: Negative for blurred vision, double vision, photophobia, pain, discharge and redness.  Respiratory: Negative for cough, hemoptysis, sputum production, shortness of breath, wheezing and stridor.   Cardiovascular: Negative for chest pain, palpitations, orthopnea, claudication, leg swelling and PND.  Gastrointestinal: negative for abdominal pain. Negative for heartburn, nausea, vomiting, diarrhea, constipation, blood in stool and melena.  Genitourinary:  Negative for dysuria, urgency, frequency, hematuria and flank pain.  Musculoskeletal: Negative for myalgias, back pain, joint pain and falls.  Skin: Negative for itching and rash.  Neurological: Negative for dizziness, tingling, tremors, sensory change, speech change, focal weakness, seizures, loss of consciousness, weakness and headaches.  Endo/Heme/Allergies: Negative for environmental allergies and polydipsia. Does not bruise/bleed easily.  Psychiatric/Behavioral: Negative for depression, suicidal ideas, hallucinations, memory loss and substance abuse. The patient is not nervous/anxious and does not have insomnia.        Objective:  Blood pressure 130/80, pulse 70, weight 174 lb (78.9 kg).   Physical Exam  Vitals reviewed. Constitutional: She is  oriented to person, place, and time. She appears well-developed and well-nourished.  HENT:  Head: Normocephalic and atraumatic.        Right Ear: External ear normal.  Left Ear: External ear normal.  Nose: Nose normal.  Mouth/Throat: Oropharynx is clear and moist.  Eyes: Conjunctivae and EOM are normal. Pupils are equal, round, and reactive to light. Right eye exhibits no discharge. Left eye exhibits no discharge. No scleral icterus.  Neck: Normal range of motion. Neck supple. No tracheal deviation present. No thyromegaly present.  Cardiovascular: Normal rate, regular rhythm, normal heart sounds and intact distal pulses.  Exam reveals no gallop and no friction rub.   No murmur heard. Respiratory: Effort normal and breath sounds normal. No respiratory distress. She has no wheezes. She has no rales. She exhibits no tenderness.  GI: Soft. Bowel sounds are normal. She exhibits no distension and no mass. There is no tenderness. There is no rebound and no guarding.  Genitourinary:  Breasts no masses skin changes or nipple changes bilaterally      Vulva is normal without lesions Vagina is pink moist without discharge Cervix normal in appearance and pap is done Uterus is normal size shape and contour Adnexa is negative with normal sized ovaries  {Rectal    hemoccult negative, normal tone, no masses  Musculoskeletal: Normal range of motion. She exhibits no edema and no tenderness.  Neurological: She is alert and oriented to person, place, and time. She has normal reflexes. She displays normal reflexes. No cranial nerve deficit. She exhibits normal muscle tone. Coordination normal.  Skin: Skin is warm and dry. No rash noted. No erythema. No pallor.  Psychiatric: She has a normal mood and affect. Her behavior is normal. Judgment and thought content normal.       Medications Ordered at today's visit: Meds ordered this encounter  Medications  . co-enzyme Q-10 30 MG capsule    Sig: Take 100 mg by  mouth daily.    Other orders placed at today's visit: No orders of the defined types were placed in this encounter.     Assessment:    Healthy female exam.    Plan:    Mammogram ordered. Follow up in: 1 year.     Return in about 1 year (around 04/26/2017) for yearly, with Dr Elonda Husky.

## 2016-04-28 ENCOUNTER — Other Ambulatory Visit: Payer: PRIVATE HEALTH INSURANCE | Admitting: Obstetrics & Gynecology

## 2017-03-13 ENCOUNTER — Other Ambulatory Visit (HOSPITAL_COMMUNITY): Payer: Self-pay | Admitting: Family Medicine

## 2017-03-13 DIAGNOSIS — Z1231 Encounter for screening mammogram for malignant neoplasm of breast: Secondary | ICD-10-CM

## 2017-03-22 ENCOUNTER — Ambulatory Visit (HOSPITAL_COMMUNITY)
Admission: RE | Admit: 2017-03-22 | Discharge: 2017-03-22 | Disposition: A | Discharge status: Home / Self Care | Payer: 59 | Source: Ambulatory Visit | Attending: Family Medicine | Admitting: Family Medicine

## 2017-03-22 DIAGNOSIS — Z1231 Encounter for screening mammogram for malignant neoplasm of breast: Secondary | ICD-10-CM | POA: Insufficient documentation

## 2017-05-10 ENCOUNTER — Telehealth: Payer: Self-pay | Admitting: Gastroenterology

## 2017-05-10 NOTE — Telephone Encounter (Signed)
[phone] please call patient, she has questions about scheduling her colonoscopy.  Received letter last year and is wondering about waiting until she turns 20

## 2017-05-11 ENCOUNTER — Ambulatory Visit (INDEPENDENT_AMBULATORY_CARE_PROVIDER_SITE_OTHER): Payer: 59 | Admitting: Obstetrics & Gynecology

## 2017-05-11 ENCOUNTER — Other Ambulatory Visit (HOSPITAL_COMMUNITY)
Admission: RE | Admit: 2017-05-11 | Discharge: 2017-05-11 | Disposition: A | Discharge status: Home / Self Care | Payer: 59 | Source: Ambulatory Visit | Attending: Obstetrics & Gynecology | Admitting: Obstetrics & Gynecology

## 2017-05-11 ENCOUNTER — Encounter: Payer: Self-pay | Admitting: Obstetrics & Gynecology

## 2017-05-11 VITALS — BP 134/70 | HR 81 | Resp 18 | Ht 62.5 in | Wt 175.0 lb

## 2017-05-11 DIAGNOSIS — Z01419 Encounter for gynecological examination (general) (routine) without abnormal findings: Secondary | ICD-10-CM | POA: Insufficient documentation

## 2017-05-11 DIAGNOSIS — Z1211 Encounter for screening for malignant neoplasm of colon: Secondary | ICD-10-CM

## 2017-05-11 DIAGNOSIS — Z1212 Encounter for screening for malignant neoplasm of rectum: Secondary | ICD-10-CM

## 2017-05-11 LAB — HEMOCCULT GUIAC POC 1CARD (OFFICE): Fecal Occult Blood, POC: NEGATIVE

## 2017-05-11 NOTE — Addendum Note (Signed)
Addended by: Octaviano Glow on: 05/11/2017 11:50 AM   Modules accepted: Orders

## 2017-05-11 NOTE — Progress Notes (Signed)
Subjective:     Kaitlyn Glass is a 66 y.o. female here for a routine exam.  No LMP recorded. Patient is postmenopausal. G1P1 Birth Control Method:  Post menopausal Menstrual Calendar(currently): amenorrheic  Current complaints: none.   Current acute medical issues:  none   Recent Gynecologic History No LMP recorded. Patient is postmenopausal. Last Pap: 2016,  normal Last mammogram: 03/2017,  normal  Past Medical History:  Diagnosis Date  . Aneurysm ([place]) 1997   no surgery  . Anxiety   . Benign familial tremor   . QMGQQPYP(950.9)     Past Surgical History:  Procedure Laterality Date  . BREAST BIOPSY  2005   negative for CA  . COLONOSCOPY  2006   Family Hx Colon Cancer - Brother  . COLONOSCOPY  02/25/2011   Procedure: COLONOSCOPY;  Surgeon: Dorothyann Peng, MD;  Location[facility];  Service: Endoscopy;  Laterality: N/A;  . EYE SURGERY      OB History    Gravida Para Term Preterm AB Living   1 1       1    SAB TAB Ectopic Multiple Live Births                  Social History   Social History  . Marital status: Married    Spouse name: N/A  . Number of children: N/A  . Years of education: N/A   Social History Main Topics  . Smoking status: Never Smoker  . Smokeless tobacco: Never Used  . Alcohol use No  . Drug use: No  . Sexual activity: Yes    Birth control/ protection: None   Other Topics Concern  . None   Social History Narrative  . None    Family History  Problem Relation Age of Onset  . Colon cancer Brother        age > 50  . Breast cancer Sister   . Throat cancer Brother      Current Outpatient Prescriptions:  .  acetaminophen (TYLENOL) 500 MG tablet, Take 500 mg by mouth every 6 (six) hours as needed. ONE TABLET DAILY , Disp: , Rfl:  .  calcium carbonate (OS-CAL) 600 MG TABS, Take 600 mg by mouth daily.  , Disp: , Rfl:  .  co-enzyme Q-10 30 MG capsule, Take 100 mg by mouth daily., Disp: , Rfl:  .  esomeprazole (NEXIUM) 40 MG capsule, Take  40 mg by mouth daily. , Disp: , Rfl:  .  propranolol (INDERAL) 10 MG tablet, Take 10 mg by mouth daily. , Disp: , Rfl:  .  simvastatin (ZOCOR) 10 MG tablet, Take 10 mg by mouth at bedtime.  , Disp: , Rfl:  .  Vitamin D, Ergocalciferol, (DRISDOL) 50000 units CAPS capsule, Take 50,000 Units by mouth every 7 (seven) days., Disp: , Rfl:  .  aspirin EC 81 MG tablet, Take 81 mg by mouth daily. , Disp: , Rfl:  .  montelukast (SINGULAIR) 10 MG tablet, Take 10 mg by mouth at bedtime.  , Disp: , Rfl:  .  Multiple Vitamin (MULTIVITAMIN) tablet, Take 1 tablet by mouth daily.  , Disp: , Rfl:   Review of Systems  Review of Systems  Constitutional: Negative for fever, chills, weight loss, malaise/fatigue and diaphoresis.  HENT: Negative for hearing loss, ear pain, nosebleeds, congestion, sore throat, neck pain, tinnitus and ear discharge.   Eyes: Negative for blurred vision, double vision, photophobia, pain, discharge and redness.  Respiratory: Negative for cough,  hemoptysis, sputum production, shortness of breath, wheezing and stridor.   Cardiovascular: Negative for chest pain, palpitations, orthopnea, claudication, leg swelling and PND.  Gastrointestinal: negative for abdominal pain. Negative for heartburn, nausea, vomiting, diarrhea, constipation, blood in stool and melena.  Genitourinary: Negative for dysuria, urgency, frequency, hematuria and flank pain.  Musculoskeletal: Negative for myalgias, back pain, joint pain and falls.  Skin: Negative for itching and rash.  Neurological: Negative for dizziness, tingling, tremors, sensory change, speech change, focal weakness, seizures, loss of consciousness, weakness and headaches.  Endo/Heme/Allergies: Negative for environmental allergies and polydipsia. Does not bruise/bleed easily.  Psychiatric/Behavioral: Negative for depression, suicidal ideas, hallucinations, memory loss and substance abuse. The patient is not nervous/anxious and does not have insomnia.         Objective:  Blood pressure 134/70, pulse 81, resp. rate 18, height 5' 2.5" (1.588 m), weight 175 lb (79.4 kg).   Physical Exam  Vitals reviewed. Constitutional: She is oriented to person, place, and time. She appears well-developed and well-nourished.  HENT:  Head: Normocephalic and atraumatic.        Right Ear: External ear normal.  Left Ear: External ear normal.  Nose: Nose normal.  Mouth/Throat: Oropharynx is clear and moist.  Eyes: Conjunctivae and EOM are normal. Pupils are equal, round, and reactive to light. Right eye exhibits no discharge. Left eye exhibits no discharge. No scleral icterus.  Neck: Normal range of motion. Neck supple. No tracheal deviation present. No thyromegaly present.  Cardiovascular: Normal rate, regular rhythm, normal heart sounds and intact distal pulses.  Exam reveals no gallop and no friction rub.   No murmur heard. Respiratory: Effort normal and breath sounds normal. No respiratory distress. She has no wheezes. She has no rales. She exhibits no tenderness.  GI: Soft. Bowel sounds are normal. She exhibits no distension and no mass. There is no tenderness. There is no rebound and no guarding.  Genitourinary:  Breasts no masses skin changes or nipple changes bilaterally      Vulva is normal without lesions Vagina is pink moist without discharge Cervix normal in appearance and pap is done Uterus is normal size shape and contour Adnexa is negative with normal sized ovaries  {Rectal    hemoccult negative, normal tone, no masses  Musculoskeletal: Normal range of motion. She exhibits no edema and no tenderness.  Neurological: She is alert and oriented to person, place, and time. She has normal reflexes. She displays normal reflexes. No cranial nerve deficit. She exhibits normal muscle tone. Coordination normal.  Skin: Skin is warm and dry. No rash noted. No erythema. No pallor.  Psychiatric: She has a normal mood and affect. Her behavior is normal.  Judgment and thought content normal.       Medications Ordered at today's visit: Meds ordered this encounter  Medications  . Vitamin D, Ergocalciferol, (DRISDOL) 50000 units CAPS capsule    Sig: Take 50,000 Units by mouth every 7 (seven) days.    Other orders placed at today's visit: No orders of the defined types were placed in this encounter.     Assessment:    Healthy female exam.    Plan:    Mammogram ordered. Follow up in: 3 years.     Return in about 3 years (around 05/11/2020) for yearly, with Dr Elonda Husky.

## 2017-05-15 LAB — CYTOLOGY - PAP
Diagnosis: NEGATIVE
HPV: NOT DETECTED

## 2017-05-17 NOTE — Telephone Encounter (Signed)
Pt has questions since she will be going on Medicare soon. She is going to check it out and let me know if she needs to be scheduled before the end of the year.  I told her to make sure she calls right away to make sure she gets in before  The end of the year.

## 2017-09-08 DIAGNOSIS — H25812 Combined forms of age-related cataract, left eye: Secondary | ICD-10-CM | POA: Diagnosis not present

## 2017-09-08 DIAGNOSIS — Z961 Presence of intraocular lens: Secondary | ICD-10-CM | POA: Diagnosis not present

## 2017-11-27 ENCOUNTER — Telehealth: Payer: Self-pay

## 2017-11-27 NOTE — Telephone Encounter (Signed)
Please schedule nurse visit

## 2017-11-27 NOTE — Telephone Encounter (Signed)
PATIENT SCHEDULED AND LETTER SENT  °

## 2017-11-27 NOTE — Telephone Encounter (Signed)
Pt left VM that she was calling to seen when she needs to schedule her next colonoscopy. She has family hx of colon cancer ( her brother).  She was mailed a letter reminder to call in 01/07/2016 to schedule. I called her and she is not having any problems, just wants to update colonoscopy. Forwarding to [employer].

## 2017-12-26 ENCOUNTER — Encounter: Payer: Self-pay | Admitting: Gastroenterology

## 2017-12-26 ENCOUNTER — Other Ambulatory Visit: Payer: Self-pay

## 2017-12-26 ENCOUNTER — Ambulatory Visit (INDEPENDENT_AMBULATORY_CARE_PROVIDER_SITE_OTHER): Payer: Self-pay

## 2017-12-26 ENCOUNTER — Ambulatory Visit (INDEPENDENT_AMBULATORY_CARE_PROVIDER_SITE_OTHER): Payer: Medicare Other | Admitting: Gastroenterology

## 2017-12-26 VITALS — BP 134/78 | HR 84 | Temp 97.6°F | Ht 62.0 in | Wt 176.2 lb

## 2017-12-26 DIAGNOSIS — Z800 Family history of malignant neoplasm of digestive organs: Secondary | ICD-10-CM | POA: Insufficient documentation

## 2017-12-26 DIAGNOSIS — R1310 Dysphagia, unspecified: Secondary | ICD-10-CM

## 2017-12-26 DIAGNOSIS — Z1211 Encounter for screening for malignant neoplasm of colon: Secondary | ICD-10-CM

## 2017-12-26 MED ORDER — PEG 3350-KCL-NA BICARB-NACL 420 G PO SOLR: 4000.0000 mL | ORAL | 0 refills | Status: DC

## 2017-12-26 NOTE — Patient Instructions (Signed)
We have scheduled you for a colonoscopy, upper endoscopy, and dilation with Dr. Oneida Alar in the near future.  We can decide on best reflux medication and dosing pending the results of the upper endoscopy.  Further recommendations to follow!  It was a pleasure to see you today. I strive to create trusting relationships with patients to provide genuine, compassionate, and quality care. I value your feedback. If you receive a survey regarding your visit,  I greatly appreciate you taking time to fill this out.   Annitta Needs, PhD, ANP-BC [facility]

## 2017-12-26 NOTE — Progress Notes (Signed)
Pt came in for a nurse visit. She said she has a chronic cough and gets choked sometimes while she is eating. She stated her pcp wanted her to have an EGD done when she had her tcs done. She had multiple questions about it. I advised her that I would only be able to schedule her tcs and she would need to see a provider. She understands this and wanted to see someone. Pt lives almost an hour away and I have put her on for an ov today with AB at 1:30.

## 2017-12-26 NOTE — Progress Notes (Signed)
Gastroenterology Pre-Procedure Review  Request Date:12/26/17 Requesting Physician: SLF recall ( last tcs 02/25/11 hyperplastic polyp- brother had CRC)  PATIENT REVIEW QUESTIONS: The patient responded to the following health history questions as indicated:    1. Diabetes Melitis: no 2. Joint replacements in the past 12 months: no 3. Major health problems in the past 3 months: no 4. Has an artificial valve or MVP: no 5. Has a defibrillator: no 6. Has been advised in past to take antibiotics in advance of a procedure like teeth cleaning: no 7. Family history of colon cancer: yes (brother age 50)  38. Alcohol Use: no 9. History of sleep apnea: no  10. History of coronary artery or other vascular stents placed within the last 12 months: no 11. History of any prior anesthesia complications: no    MEDICATIONS & ALLERGIES:    Patient reports the following regarding taking any blood thinners:   Plavix? no Aspirin? no Coumadin? no Brilinta? no Xarelto? no Eliquis? no Pradaxa? no Savaysa? no Effient? no  Patient confirms/reports the following medications:  Current Outpatient Medications  Medication Sig Dispense Refill  . acetaminophen (TYLENOL) 500 MG tablet Take 500 mg by mouth every 6 (six) hours as needed. ONE TABLET DAILY     . calcium carbonate (OS-CAL) 600 MG TABS Take 600 mg by mouth daily.      . Cholecalciferol (VITAMIN D3) 2000 units TABS Take by mouth daily.    Marland Kitchen co-enzyme Q-10 30 MG capsule Take 100 mg by mouth daily.    Marland Kitchen esomeprazole (NEXIUM) 40 MG capsule Take 40 mg by mouth as needed.     . propranolol (INDERAL) 10 MG tablet Take 10 mg by mouth daily.     . simvastatin (ZOCOR) 10 MG tablet Take 10 mg by mouth at bedtime.       No current facility-administered medications for this visit.     Patient confirms/reports the following allergies:  Allergies  Allergen Reactions  . Codeine     Nausea  . Penicillins     HIVES AND SWELLING OF THE THROAT    No orders of  the defined types were placed in this encounter.   AUTHORIZATION INFORMATION  SCHEDULE INFORMATION: Procedure has been scheduled as follows:  Date:  Time:  Location:   This Gastroenterology Pre-Precedure Review Form is being routed to the following provider(s):

## 2017-12-26 NOTE — Progress Notes (Addendum)
REVIEWED-NO ADDITIONAL RECOMMENDATIONS.  Primary Care Physician:  Redmond School, MD Primary Gastroenterologist:  Dr. Oneida Alar   Chief Complaint  Patient presents with  . Dysphagia    trouble with food feeling like it is getting stuck. C/o cough. Was told by PCP needs EGD at time of TCS (not scheduled yet)    HPI:   Kaitlyn Glass is a 67 y.o. female presenting today at the request of Dr. Gerarda Fraction due to need for surveillance colonoscopy and consideration of EGD. She has a family history of colon cancer, with brother diagnosed age 68. Her last colonoscopy was in 2012 with sessile hyperplastic rectal polyp, internal hemorrhoids. Colonoscopy due 5-10 years.   She notes a chronic cough. Changed filter in house. Tried inhaler but not able to tolerate. Has taken Singulair. Nexium only as needed. Originally had been taking Nexium daily in the past when seen by Dr. Orson Ape, but it didn't help with reflux. Usually will take Nexium if feels symptomatic but not on an empty stomach. Notes solid food dysphagia a little over a year. Goes down in a few seconds but feels like she can't breathe when it happens. Denies hoarseness.   EGD about 30 years ago.   Past Medical History:  Diagnosis Date  . Aneurysm ([place]) 1997   no surgery  . Anxiety   . Benign familial tremor   . GERD (gastroesophageal reflux disease)   . Headache(784.0)   . Hypercholesterolemia     Past Surgical History:  Procedure Laterality Date  . BREAST BIOPSY  2005   negative for CA  . COLONOSCOPY  2006   Family Hx Colon Cancer - Brother  . COLONOSCOPY  02/25/2011   Dr. Oneida Alar: sessile hyperplastic rectal polyp, surveillance in 5-10 years  . EYE SURGERY      Current Outpatient Medications  Medication Sig Dispense Refill  . acetaminophen (TYLENOL) 500 MG tablet Take 500 mg by mouth every 6 (six) hours as needed. ONE TABLET DAILY     . calcium carbonate (OS-CAL) 600 MG TABS Take 600 mg by mouth daily.      . Cholecalciferol  (VITAMIN D3) 2000 units TABS Take by mouth daily.    Marland Kitchen co-enzyme Q-10 30 MG capsule Take 100 mg by mouth daily.    Marland Kitchen esomeprazole (NEXIUM) 40 MG capsule Take 40 mg by mouth as needed.     . propranolol (INDERAL) 10 MG tablet Take 10 mg by mouth daily.     . simvastatin (ZOCOR) 10 MG tablet Take 10 mg by mouth at bedtime.      . polyethylene glycol-electrolytes (TRILYTE) 420 g solution Take 4,000 mLs by mouth as directed. 4000 mL 0   No current facility-administered medications for this visit.     Allergies as of 12/26/2017 - Review Complete 12/26/2017  Allergen Reaction Noted  . Codeine  02/10/2011  . Penicillins  02/10/2011    Family History  Problem Relation Age of Onset  . Colon cancer Brother        age >  32 (diagnosed at age 45)  . Breast cancer Sister   . Throat cancer Brother     Social History   Socioeconomic History  . Marital status: Married    Spouse name: Not on file  . Number of children: Not on file  . Years of education: Not on file  . Highest education level: Not on file  Occupational History  . Not on file  Social Needs  . Financial resource strain:  Not on file  . Food insecurity:    Worry: Not on file    Inability: Not on file  . Transportation needs:    Medical: Not on file    Non-medical: Not on file  Tobacco Use  . Smoking status: Never Smoker  . Smokeless tobacco: Never Used  Substance and Sexual Activity  . Alcohol use: No  . Drug use: No  . Sexual activity: Yes    Birth control/protection: None  Lifestyle  . Physical activity:    Days per week: Not on file    Minutes per session: Not on file  . Stress: Not on file  Relationships  . Social connections:    Talks on phone: Not on file    Gets together: Not on file    Attends religious service: Not on file    Active member of club or organization: Not on file    Attends meetings of clubs or organizations: Not on file    Relationship status: Not on file  . Intimate partner violence:      Fear of current or ex partner: Not on file    Emotionally abused: Not on file    Physically abused: Not on file    Forced sexual activity: Not on file  Other Topics Concern  . Not on file  Social History Narrative  . Not on file    Review of Systems: Gen: Denies any fever, chills, fatigue, weight loss, lack of appetite.  CV: Denies chest pain, heart palpitations, peripheral edema, syncope.  Resp: Denies shortness of breath at rest or with exertion. Denies wheezing or cough.  GI: see HPI  GU : Denies urinary burning, urinary frequency, urinary hesitancy MS: Denies joint pain, muscle weakness, cramps, or limitation of movement.  Derm: Denies rash, itching, dry skin Psych: Denies depression, anxiety, memory loss, and confusion Heme: Denies bruising, bleeding, and enlarged lymph nodes.  Physical Exam: BP 134/78   Pulse 84   Temp 97.6 F (36.4 C) (Oral)   Ht 5\' 2"  (1.575 m)   Wt 176 lb 3.2 oz (79.9 kg)   BMI 32.23 kg/m  General:   Alert and oriented. Pleasant and cooperative. Well-nourished and well-developed.  Head:  Normocephalic and atraumatic. Eyes:  Without icterus, sclera clear and conjunctiva pink.  Ears:  Normal auditory acuity. Nose:  No deformity, discharge,  or lesions. Mouth:  No deformity or lesions, oral mucosa pink.  Lungs:  Clear to auscultation bilaterally. No wheezes, rales, or rhonchi. No distress.  Heart:  S1, S2 present without murmurs appreciated.  Abdomen:  +BS, soft, non-tender and non-distended. No HSM noted. No guarding or rebound. No masses appreciated.  Rectal:  Deferred  Msk:  Symmetrical without gross deformities. Normal posture. Extremities:  Without  edema. Neurologic:  Alert and  oriented x4 Psych:  Alert and cooperative. Normal mood and affect.

## 2017-12-31 NOTE — Assessment & Plan Note (Signed)
Solid food dysphagia for approximately one year. Nexium prn for GERD symptoms. Last EGD 30 years ago. Will pursue EGD at time of colonoscopy. Discussed likelihood of needing to take daily PPI: await EGD findings.  Proceed with upper endoscopy/dilation in the near future with Dr. Oneida Alar. The risks, benefits, and alternatives have been discussed in detail with patient. They have stated understanding and desire to proceed.

## 2017-12-31 NOTE — Assessment & Plan Note (Signed)
67 year old female with family history of colon cancer in brother (age greater than 39), with last colonoscopy in 2012 and due for colonoscopy 5-10 years. No concerning lower GI symptoms.   Proceed with colonoscopy with Dr. Oneida Alar in the near future. The risks, benefits, and alternatives have been discussed in detail with the patient. They state understanding and desire to proceed.

## 2018-01-01 NOTE — Progress Notes (Signed)
cc'ed to pcp °

## 2018-01-31 DIAGNOSIS — Z1389 Encounter for screening for other disorder: Secondary | ICD-10-CM | POA: Diagnosis not present

## 2018-01-31 DIAGNOSIS — J040 Acute laryngitis: Secondary | ICD-10-CM | POA: Diagnosis not present

## 2018-01-31 DIAGNOSIS — Z6831 Body mass index (BMI) 31.0-31.9, adult: Secondary | ICD-10-CM | POA: Diagnosis not present

## 2018-01-31 DIAGNOSIS — J22 Unspecified acute lower respiratory infection: Secondary | ICD-10-CM | POA: Diagnosis not present

## 2018-01-31 DIAGNOSIS — F419 Anxiety disorder, unspecified: Secondary | ICD-10-CM | POA: Diagnosis not present

## 2018-01-31 DIAGNOSIS — E6609 Other obesity due to excess calories: Secondary | ICD-10-CM | POA: Diagnosis not present

## 2018-02-19 ENCOUNTER — Encounter (HOSPITAL_COMMUNITY)
Admission: RE | Disposition: A | Discharge status: Home / Self Care | Payer: Self-pay | Source: Ambulatory Visit | Attending: Gastroenterology

## 2018-02-19 ENCOUNTER — Ambulatory Visit (HOSPITAL_COMMUNITY)
Admission: RE | Admit: 2018-02-19 | Discharge: 2018-02-19 | Disposition: A | Discharge status: Home / Self Care | Payer: Medicare Other | Source: Ambulatory Visit | Attending: Gastroenterology | Admitting: Gastroenterology

## 2018-02-19 ENCOUNTER — Encounter (HOSPITAL_COMMUNITY): Payer: Self-pay

## 2018-02-19 ENCOUNTER — Other Ambulatory Visit: Payer: Self-pay

## 2018-02-19 DIAGNOSIS — Z79899 Other long term (current) drug therapy: Secondary | ICD-10-CM | POA: Diagnosis not present

## 2018-02-19 DIAGNOSIS — G250 Essential tremor: Secondary | ICD-10-CM | POA: Diagnosis not present

## 2018-02-19 DIAGNOSIS — K644 Residual hemorrhoidal skin tags: Secondary | ICD-10-CM | POA: Insufficient documentation

## 2018-02-19 DIAGNOSIS — F419 Anxiety disorder, unspecified: Secondary | ICD-10-CM | POA: Diagnosis not present

## 2018-02-19 DIAGNOSIS — Z1211 Encounter for screening for malignant neoplasm of colon: Secondary | ICD-10-CM

## 2018-02-19 DIAGNOSIS — Z880 Allergy status to penicillin: Secondary | ICD-10-CM | POA: Insufficient documentation

## 2018-02-19 DIAGNOSIS — K3189 Other diseases of stomach and duodenum: Secondary | ICD-10-CM | POA: Diagnosis not present

## 2018-02-19 DIAGNOSIS — Z885 Allergy status to narcotic agent status: Secondary | ICD-10-CM | POA: Insufficient documentation

## 2018-02-19 DIAGNOSIS — Z86010 Personal history of colonic polyps: Secondary | ICD-10-CM | POA: Insufficient documentation

## 2018-02-19 DIAGNOSIS — Z808 Family history of malignant neoplasm of other organs or systems: Secondary | ICD-10-CM | POA: Diagnosis not present

## 2018-02-19 DIAGNOSIS — R1310 Dysphagia, unspecified: Secondary | ICD-10-CM

## 2018-02-19 DIAGNOSIS — K219 Gastro-esophageal reflux disease without esophagitis: Secondary | ICD-10-CM | POA: Diagnosis not present

## 2018-02-19 DIAGNOSIS — K317 Polyp of stomach and duodenum: Secondary | ICD-10-CM | POA: Diagnosis not present

## 2018-02-19 DIAGNOSIS — Z800 Family history of malignant neoplasm of digestive organs: Secondary | ICD-10-CM | POA: Diagnosis not present

## 2018-02-19 DIAGNOSIS — Q438 Other specified congenital malformations of intestine: Secondary | ICD-10-CM | POA: Insufficient documentation

## 2018-02-19 DIAGNOSIS — E7800 Pure hypercholesterolemia, unspecified: Secondary | ICD-10-CM | POA: Insufficient documentation

## 2018-02-19 HISTORY — PX: ESOPHAGOGASTRODUODENOSCOPY: SHX5428

## 2018-02-19 HISTORY — PX: COLONOSCOPY: SHX5424

## 2018-02-19 HISTORY — PX: SAVORY DILATION: SHX5439

## 2018-02-19 HISTORY — PX: BIOPSY: SHX5522

## 2018-02-19 SURGERY — COLONOSCOPY
Anesthesia: Moderate Sedation

## 2018-02-19 MED ORDER — MEPERIDINE HCL 100 MG/ML IJ SOLN
INTRAMUSCULAR | Status: AC
  Filled 2018-02-19: qty 2

## 2018-02-19 MED ORDER — SODIUM CHLORIDE 0.9 % IV SOLN
INTRAVENOUS | Status: DC
  Administered 2018-02-19: 11:00:00 via INTRAVENOUS

## 2018-02-19 MED ORDER — LIDOCAINE VISCOUS HCL 2 % MT SOLN
OROMUCOSAL | Status: DC | PRN
  Administered 2018-02-19: 1 via OROMUCOSAL

## 2018-02-19 MED ORDER — LIDOCAINE VISCOUS HCL 2 % MT SOLN
OROMUCOSAL | Status: AC
  Filled 2018-02-19: qty 15

## 2018-02-19 MED ORDER — MEPERIDINE HCL 100 MG/ML IJ SOLN
INTRAMUSCULAR | Status: DC | PRN
  Administered 2018-02-19 (×4): 25 mg via INTRAVENOUS

## 2018-02-19 MED ORDER — MIDAZOLAM HCL 5 MG/5ML IJ SOLN
INTRAMUSCULAR | Status: DC | PRN
  Administered 2018-02-19: 1 mg via INTRAVENOUS
  Administered 2018-02-19: 2 mg via INTRAVENOUS
  Administered 2018-02-19: 1 mg via INTRAVENOUS
  Administered 2018-02-19: 2 mg via INTRAVENOUS

## 2018-02-19 MED ORDER — MIDAZOLAM HCL 5 MG/5ML IJ SOLN
INTRAMUSCULAR | Status: AC
  Filled 2018-02-19: qty 10

## 2018-02-19 MED ORDER — MINERAL OIL PO OIL
TOPICAL_OIL | ORAL | Status: AC
  Filled 2018-02-19: qty 30

## 2018-02-19 MED ORDER — STERILE WATER FOR IRRIGATION IR SOLN
Status: DC | PRN
  Administered 2018-02-19: 13:00:00

## 2018-02-19 NOTE — Op Note (Signed)
[facility] Patient Name: Kaitlyn Glass Procedure Date: 02/19/2018 1:39 PM MRN: [id number] Date of Birth: 07/01/1951 Attending MD: Barney Drain MD, MD CSN: [id number] Age: 67 Admit Type: Outpatient Procedure:                Upper GI endoscopy WITH ESOPHAGEAL DILATION/COLD                            FORCEPS BIOPSY Indications:              Dysphagia W/ GERD SX < 1-2X/WEEK. FLARES OCCUR                            AFTER DIETARY NON-ADHERENCE. WEIGHT IN JUL 2012-145                            LBS AND NOW IN 2019 170 LBS. Providers:                Barney Drain MD, MD, Lurline Del, RN, Randa Spike, Technician Referring MD:             Halford Chessman MD, MD Medicines:                TCS + Meperidine 25 mg IV, Midazolam 2 mg IV Complications:            No immediate complications. Estimated Blood Loss:     Estimated blood loss was minimal. Procedure:                Pre-Anesthesia Assessment:                           - Prior to the procedure, a History and Physical                            was performed, and patient medications and                            allergies were reviewed. The patient's tolerance of                            previous anesthesia was also reviewed. The risks                            and benefits of the procedure and the sedation                            options and risks were discussed with the patient.                            All questions were answered, and informed consent                            was obtained. Prior Anticoagulants: The patient has  taken no previous anticoagulant or antiplatelet                            agents. ASA Grade Assessment: II - A patient with                            mild systemic disease. After reviewing the risks                            and benefits, the patient was deemed in                            satisfactory condition to undergo the procedure.                             After obtaining informed consent, the endoscope was                            passed under direct vision. Throughout the                            procedure, the patient's blood pressure, pulse, and                            oxygen saturations were monitored continuously. The                            [id number]-[id number] ([id number]) scope was introduced through the                            mouth, and advanced to the second part of duodenum.                            The upper GI endoscopy was somewhat difficult due                            to the patient's agitation. Successful completion                            of the procedure was aided by increasing the dose                            of sedation medication. The patient tolerated the                            procedure fairly well. Scope In: 1:46:04 PM Scope Out: 1:53:14 PM Total Procedure Duration: 0 hours 7 minutes 10 seconds  Findings:      No endoscopic abnormality was evident in the esophagus to explain the       patient's complaint of dysphagia. It was decided, however, to proceed       with dilation of the entire esophagus. A guidewire was placed and the       scope was withdrawn. Dilation was performed with a Savary  dilator with       mild resistance at 16 mm.      Multiple small sessile polyps were found in the gastric fundus and in       the gastric body. The polyp was removed with a cold biopsy forceps.       Resection and retrieval were complete.      Patchy mild inflammation characterized by congestion (edema) and       erythema was found in the gastric antrum. Biopsies were taken with a       cold forceps for Helicobacter pylori testing.      The examined duodenum was normal. Impression:               - DYSPHAGUIA DUE TO UNCONTROELLED GERD, OR                            ESOPHAGEAL SPASM.                           - Multiple gastric polyps.                           - MILD Gastritis. Moderate  Sedation:      Moderate (conscious) sedation was administered by the endoscopy nurse       and supervised by the endoscopist. The following parameters were       monitored: oxygen saturation, heart rate, blood pressure, and response       to care. Total physician intraservice time was 38 minutes. Recommendation:           - Await pathology results.                           - High fiber diet and low fat diet.                           - Continue present medications.                           - Return to my office in 4 months. IF DYSPHAGIA                            PERSISTS, WILL NEED SWALLOWING EVALUATION AT                            [facility].                           - Patient has a contact number available for                            emergencies. The signs and symptoms of potential                            delayed complications were discussed with the                            patient. Return to normal activities tomorrow.  Written discharge instructions were provided to the                            patient. Procedure Code(s):        --- Professional ---                           [phone], Esophagogastroduodenoscopy, flexible,                            transoral; with insertion of guide wire followed by                            passage of dilator(s) through esophagus over guide                            wire                           [id number], Esophagogastroduodenoscopy, flexible,                            transoral; with biopsy, single or multiple                           G0500, Moderate sedation services provided by the                            same physician or other qualified health care                            professional performing a gastrointestinal                            endoscopic service that sedation supports,                            requiring the presence of an independent trained                            observer to assist  in the monitoring of the                            patient's level of consciousness and physiological                            status; initial 15 minutes of intra-service time;                            patient age 71 years or older (additional time may                            be reported with [phone], as appropriate)                           [phone], Moderate sedation  services provided by the                            same physician or other qualified health care                            professional performing the diagnostic or                            therapeutic service that the sedation supports,                            requiring the presence of an independent trained                            observer to assist in the monitoring of the                            patient's level of consciousness and physiological                            status; each additional 15 minutes intraservice                            time (List separately in addition to code for                            primary service)                           [phone], Moderate sedation services provided by the                            same physician or other qualified health care                            professional performing the diagnostic or                            therapeutic service that the sedation supports,                            requiring the presence of an independent trained                            observer to assist in the monitoring of the                            patient's level of consciousness and physiological                            status; each additional 15 minutes intraservice                            time (List separately in  addition to code for                            primary service) Diagnosis Code(s):        --- Professional ---                           R13.10, Dysphagia, unspecified                           K31.7, Polyp of stomach and duodenum                            K29.70, Gastritis, unspecified, without bleeding CPT copyright 2017 [employer]. All rights reserved. The codes documented in this report are preliminary and upon coder review may  be revised to meet current compliance requirements. Barney Drain, MD Barney Drain MD, MD 02/19/2018 2:13:03 PM This report has been signed electronically. Number of Addenda: 0

## 2018-02-19 NOTE — Op Note (Signed)
[facility] Patient Name: Kaitlyn Glass Procedure Date: 02/19/2018 12:37 PM MRN: [id number] Date of Birth: 1951/05/19 Attending MD: Barney Drain MD, MD CSN: [id number] Age: 67 Admit Type: Outpatient Procedure:                Colonoscopy, SCREENING Indications:              Family history of colon cancer in a first-degree                            relative(AGE > 58), LAST TCS 2012: HYPERPLASTIC                            POLYPS Providers:                Barney Drain MD, MD, Lurline Del, RN, Randa Spike, Technician Referring MD:             Halford Chessman MD, MD Medicines:                Meperidine 75 mg IV, Midazolam 5 mg IV Complications:            No immediate complications. Estimated Blood Loss:     Estimated blood loss: none. Procedure:                Pre-Anesthesia Assessment:                           - Prior to the procedure, a History and Physical                            was performed, and patient medications and                            allergies were reviewed. The patient's tolerance of                            previous anesthesia was also reviewed. The risks                            and benefits of the procedure and the sedation                            options and risks were discussed with the patient.                            All questions were answered, and informed consent                            was obtained. Prior Anticoagulants: The patient has                            taken no previous anticoagulant or antiplatelet  agents. ASA Grade Assessment: II - A patient with                            mild systemic disease. After reviewing the risks                            and benefits, the patient was deemed in                            satisfactory condition to undergo the procedure.                            After obtaining informed consent, the colonoscope                            was  passed under direct vision. Throughout the                            procedure, the patient's blood pressure, pulse, and                            oxygen saturations were monitored continuously. The                            CF-[id number] ([id number]) scope was introduced through                            the anus and advanced to the 7 cm into the ileum.                            The colonoscopy was technically difficult and                            complex due to restricted mobility of the colon,                            significant looping and the patient's agitation.                            Successful completion of the procedure was aided by                            increasing the dose of sedation medication,                            changing the patient to a supine position,                            withdrawing the scope and replacing with the                            pediatric colonoscope, straightening and shortening  the scope to obtain bowel loop reduction and                            COLOWRAP. The patient tolerated the procedure                            fairly well. The quality of the bowel preparation                            was good. Scope In: 1:18:19 PM Scope Out: 1:35:33 PM Scope Withdrawal Time: 0 hours 13 minutes 28 seconds  Total Procedure Duration: 0 hours 17 minutes 14 seconds  Findings:      The terminal ileum appeared normal.      The sigmoid colon and descending colon were significantly redundant.      External hemorrhoids were found. Impression:               - The examined portion of the ileum was normal.                           - Redundant LEFT colon.                           - External hemorrhoids. Moderate Sedation:      Moderate (conscious) sedation was administered by the endoscopy nurse       and supervised by the endoscopist. The following parameters were       monitored: oxygen saturation, heart rate, blood  pressure, and response       to care. Total physician intraservice time was 38 minutes. Recommendation:           - Repeat colonoscopy in 10 years for surveillance                            WITH PEDS SCOPE AND PROPOFOL.                           - High fiber diet.                           - Continue present medications.                           - Patient has a contact number available for                            emergencies. The signs and symptoms of potential                            delayed complications were discussed with the                            patient. Return to normal activities tomorrow.                            Written discharge instructions were provided to the  patient. Procedure Code(s):        --- Professional ---                           [phone], Colonoscopy, flexible; diagnostic, including                            collection of specimen(s) by brushing or washing,                            when performed (separate procedure)                           G0500, Moderate sedation services provided by the                            same physician or other qualified health care                            professional performing a gastrointestinal                            endoscopic service that sedation supports,                            requiring the presence of an independent trained                            observer to assist in the monitoring of the                            patient's level of consciousness and physiological                            status; initial 15 minutes of intra-service time;                            patient age 27 years or older (additional time may                            be reported with [phone], as appropriate)                           [phone], Moderate sedation services provided by the                            same physician or other qualified health care                            professional  performing the diagnostic or                            therapeutic service that the sedation supports,  requiring the presence of an independent trained                            observer to assist in the monitoring of the                            patient's level of consciousness and physiological                            status; each additional 15 minutes intraservice                            time (List separately in addition to code for                            primary service)                           [phone], Moderate sedation services provided by the                            same physician or other qualified health care                            professional performing the diagnostic or                            therapeutic service that the sedation supports,                            requiring the presence of an independent trained                            observer to assist in the monitoring of the                            patient's level of consciousness and physiological                            status; each additional 15 minutes intraservice                            time (List separately in addition to code for                            primary service) Diagnosis Code(s):        --- Professional ---                           K64.4, Residual hemorrhoidal skin tags                           Z80.0, Family history of malignant neoplasm of  digestive organs                           Q43.8, Other specified congenital malformations of                            intestine CPT copyright 2017 American Medical Association. All rights reserved. The codes documented in this report are preliminary and upon coder review may  be revised to meet current compliance requirements. Barney Drain, MD Barney Drain MD, MD 02/19/2018 2:03:47 PM This report has been signed electronically. Number of Addenda: 0

## 2018-02-19 NOTE — H&P (Signed)
Primary Care Physician:  Sharilyn Sites, MD Primary Gastroenterologist:  Dr. Oneida Alar  Pre-Procedure History & Physical: HPI:  Kaitlyn Glass is a 67 y.o. female here for FAMILY Hx COLON CA-FATHER HAD COLON CA AGE > 60/DYSPHAGIA-GERD ON PRN PPI.  Past Medical History:  Diagnosis Date  . Aneurysm ([place]) 1997   no surgery  . Anxiety   . Benign familial tremor   . GERD (gastroesophageal reflux disease)   . Headache(784.0)   . Hypercholesterolemia     Past Surgical History:  Procedure Laterality Date  . BREAST BIOPSY  2005   negative for CA  . COLONOSCOPY  2006   Family Hx Colon Cancer - Brother  . COLONOSCOPY  02/25/2011   Dr. Oneida Alar: sessile hyperplastic rectal polyp, surveillance in 5-10 years  . ESOPHAGOGASTRODUODENOSCOPY ENDOSCOPY    . EYE SURGERY      Prior to Admission medications   Medication Sig Start Date End Date Taking? Authorizing Provider  acetaminophen (TYLENOL) 500 MG tablet Take 500 mg by mouth every 6 (six) hours as needed for moderate pain or headache.    Yes [provider]  Calcium Carb-Cholecalciferol (CALCIUM 600/VITAMIN D3 PO) Take 1 tablet by mouth daily.   Yes [provider]  Cholecalciferol (VITAMIN D3) 2000 units TABS Take 2,000 Units by mouth daily.    Yes [provider]  esomeprazole (NEXIUM) 40 MG capsule Take 40 mg by mouth daily as needed (for acid reflux).  05/15/12  Yes [provider]  propranolol (INDERAL) 10 MG tablet Take 10 mg by mouth daily.    Yes [provider]  simvastatin (ZOCOR) 10 MG tablet Take 10 mg by mouth every evening.    Yes [provider]  polyethylene glycol-electrolytes (TRILYTE) 420 g solution Take 4,000 mLs by mouth as directed. 12/26/17   Danie Binder, MD    Allergies as of 12/26/2017 - Review Complete 12/26/2017  Allergen Reaction Noted  . Codeine  02/10/2011  . Penicillins  02/10/2011    Family History  Problem Relation Age of Onset  . Colon cancer Brother         age >  1 (diagnosed at age 70)  . Breast cancer Sister   . Throat cancer Brother     Social History   Socioeconomic History  . Marital status: Married    Spouse name: Not on file  . Number of children: Not on file  . Years of education: Not on file  . Highest education level: Not on file  Occupational History  . Not on file  Social Needs  . Financial resource strain: Not on file  . Food insecurity:    Worry: Not on file    Inability: Not on file  . Transportation needs:    Medical: Not on file    Non-medical: Not on file  Tobacco Use  . Smoking status: Never Smoker  . Smokeless tobacco: Never Used  Substance and Sexual Activity  . Alcohol use: No  . Drug use: No  . Sexual activity: Yes    Birth control/protection: None  Lifestyle  . Physical activity:    Days per week: Not on file    Minutes per session: Not on file  . Stress: Not on file  Relationships  . Social connections:    Talks on phone: Not on file    Gets together: Not on file    Attends religious service: Not on file    Active member of club or organization: Not on  file    Attends meetings of clubs or organizations: Not on file    Relationship status: Not on file  . Intimate partner violence:    Fear of current or ex partner: Not on file    Emotionally abused: Not on file    Physically abused: Not on file    Forced sexual activity: Not on file  Other Topics Concern  . Not on file  Social History Narrative  . Not on file    Review of Systems: See HPI, otherwise negative ROS   Physical Exam: BP (!) 152/89   Pulse 83   Temp 98.4 F (36.9 C) (Oral)   Resp 14   Ht 5\' 2"  (1.575 m)   Wt 170 lb (77.1 kg)   SpO2 98%   BMI 31.09 kg/m  General:   Alert,  pleasant and cooperative in NAD Head:  Normocephalic and atraumatic. Neck:  Supple; Lungs:  Clear throughout to auscultation.    Heart:  Regular rate and rhythm. Abdomen:  Soft, nontender and nondistended. Normal bowel sounds, without  guarding, and without rebound.   Neurologic:  Alert and  oriented x4;  grossly normal neurologically.  Impression/Plan:     FAMILY Hx COLON CA-FATHER HAD COLON CA AGE > 60/DYSPHAGIA-GERD ON PRN PPI.  PLAN: 1. TCS/EGD/DIL TODAY DISCUSSED PROCEDURE, BENEFITS, & RISKS: < 1% chance of medication reaction, bleeding, OR perforation.

## 2018-02-19 NOTE — Discharge Instructions (Signed)
YOU DID NOT HAVE ANY POLYPS. YOU HAVE SMALL EXTERNAL HEMORRHOIDS. YOU HAVE A BENIGN APPEARING STOMACH POLYPS AND MILD GASTRITIS. I STRETCHED YOUR ESOPHAGUS DUE YOUR PROBLEMS SWALLOWING. I DID NOT SEE A DEFINITE STRICTURE. I BIOPSIED YOUR STOMACH.   DRINK WATER TO KEEP YOUR URINE LIGHT YELLOW.  CONTINUE YOUR WEIGHT LOSS EFFORTS. You have gained 25 lbs since Jul 2012. YOUR BODY MASS INDEX IS OVER 30 WHICH MEANS YOU ARE OBESE. OBESITY IS ASSOCIATED WITH AN INCREASED FOR CIRRHOSIS AND ALL CANCERS, INCLUDING ESOPHAGEAL AND COLON CANCER. A WEIGHT OF 162 lbs LBS OR LESS  WILL GET YOUR BODY MASS INDEX(BMI) UNDER 30.  FOLLOW A HIGH FIBER/LOW FAT DIET. AVOID ITEMS THAT CAUSE BLOATING. SEE INFO BELOW.   YOUR BIOPSY WILL BE BACK IN 7 DAYS.  Follow up in 4 mos with Dr. Oneida Alar.  SEE DR. ED HAWKINS. [place], PH# [phone], TO ASSESS YOUR COUGH.   Next colonoscopy in 10 years BECAUSE YOU HAVE HAD TWO COLONOSCOPIES WITHOUT ADENOMAS BEING REMOVED AND YOUR BROTHER HAD COLON CANCER AFTER AGE 38.   ENDOSCOPY Care After Read the instructions outlined below and refer to this sheet in the next week. These discharge instructions provide you with general information on caring for yourself after you leave the hospital. While your treatment has been planned according to the most current medical practices available, unavoidable complications occasionally occur. If you have any problems or questions after discharge, call DR. Anamika Kueker, [phone].  ACTIVITY  You may resume your regular activity, but move at a slower pace for the next 24 hours.   Take frequent rest periods for the next 24 hours.   Walking will help get rid of the air and reduce the bloated feeling in your belly (abdomen).   No driving for 24 hours (because of the medicine (anesthesia) used during the test).   You may shower.   Do not sign any important legal documents or operate any machinery for 24 hours (because of the anesthesia  used during the test).    NUTRITION  Drink plenty of fluids.   You may resume your normal diet as instructed by your doctor.   Begin with a light meal and progress to your normal diet. Heavy or fried foods are harder to digest and may make you feel sick to your stomach (nauseated).   Avoid alcoholic beverages for 24 hours or as instructed.    MEDICATIONS  You may resume your normal medications.   WHAT YOU CAN EXPECT TODAY  Some feelings of bloating in the abdomen.   Passage of more gas than usual.   Spotting of blood in your stool or on the toilet paper  .  IF YOU HAD POLYPS REMOVED DURING THE ENDOSCOPY:  Eat a soft diet IF YOU HAVE NAUSEA, BLOATING, ABDOMINAL PAIN, OR VOMITING.    FINDING OUT THE RESULTS OF YOUR TEST Not all test results are available during your visit. DR. Oneida Alar WILL CALL YOU WITHIN 7 DAYS OF YOUR PROCEDUE WITH YOUR RESULTS. Do not assume everything is normal if you have not heard from DR. Nyala Kirchner IN ONE WEEK, CALL HER OFFICE AT [phone].  SEEK IMMEDIATE MEDICAL ATTENTION AND CALL THE OFFICE: [phone] IF:  You have more than a spotting of blood in your stool.   Your belly is swollen (abdominal distention).   You are nauseated or vomiting.   You have a temperature over 101F.   You have abdominal pain or discomfort that is severe or gets worse throughout the day.  Low-Fat Diet BREADS, CEREALS, PASTA, RICE, DRIED PEAS, AND BEANS These products are high in carbohydrates and most are low in fat. Therefore, they can be increased in the diet as substitutes for fatty foods. They too, however, contain calories and should not be eaten in excess. Cereals can be eaten for snacks as well as for breakfast.   FRUITS AND VEGETABLES It is good to eat fruits and vegetables. Besides being sources of fiber, both are rich in vitamins and some minerals. They help you get the daily allowances of these nutrients. Fruits and vegetables can be used for snacks and  desserts.  MEATS Limit lean meat, chicken, [place], and fish to no more than 6 ounces per day. Beef, Pork, and Lamb Use lean cuts of beef, pork, and lamb. Lean cuts include:  Extra-lean ground beef.  Arm roast.  Sirloin tip.  Center-cut ham.  Round steak.  Loin chops.  Rump roast.  Tenderloin.  Trim all fat off the outside of meats before cooking. It is not necessary to severely decrease the intake of red meat, but lean choices should be made. Lean meat is rich in protein and contains a highly absorbable form of iron. Premenopausal women, in particular, should avoid reducing lean red meat because this could increase the risk for low red blood cells (iron-deficiency anemia).  Chicken and [place] These are good sources of protein. The fat of poultry can be reduced by removing the skin and underlying fat layers before cooking. Chicken and [place] can be substituted for lean red meat in the diet. Poultry should not be fried or covered with high-fat sauces. Fish and Shellfish Fish is a good source of protein. Shellfish contain cholesterol, but they usually are low in saturated fatty acids. The preparation of fish is important. Like chicken and [place], they should not be fried or covered with high-fat sauces. EGGS Egg whites contain no fat or cholesterol. They can be eaten often. Try 1 to 2 egg whites instead of whole eggs in recipes or use egg substitutes that do not contain yolk. MILK AND DAIRY PRODUCTS Use skim or 1% milk instead of 2% or whole milk. Decrease whole milk, natural, and processed cheeses. Use nonfat or low-fat (2%) cottage cheese or low-fat cheeses made from vegetable oils. Choose nonfat or low-fat (1 to 2%) yogurt. Experiment with evaporated skim milk in recipes that call for heavy cream. Substitute low-fat yogurt or low-fat cottage cheese for sour cream in dips and salad dressings. Have at least 2 servings of low-fat dairy products, such as 2 glasses of skim (or 1%) milk each day to  help get your daily calcium intake. FATS AND OILS Reduce the total intake of fats, especially saturated fat. Butterfat, lard, and beef fats are high in saturated fat and cholesterol. These should be avoided as much as possible. Vegetable fats do not contain cholesterol, but certain vegetable fats, such as coconut oil, palm oil, and palm kernel oil are very high in saturated fats. These should be limited. These fats are often used in bakery goods, processed foods, popcorn, oils, and nondairy creamers. Vegetable shortenings and some peanut butters contain hydrogenated oils, which are also saturated fats. Read the labels on these foods and check for saturated vegetable oils. Unsaturated vegetable oils and fats do not raise blood cholesterol. However, they should be limited because they are fats and are high in calories. Total fat should still be limited to 30% of your daily caloric intake. Desirable liquid vegetable oils are corn oil, cottonseed  oil, olive oil, canola oil, safflower oil, soybean oil, and sunflower oil. Peanut oil is not as good, but small amounts are acceptable. Buy a heart-healthy tub margarine that has no partially hydrogenated oils in the ingredients. Mayonnaise and salad dressings often are made from unsaturated fats, but they should also be limited because of their high calorie and fat content. Seeds, nuts, peanut butter, olives, and avocados are high in fat, but the fat is mainly the unsaturated type. These foods should be limited mainly to avoid excess calories and fat. OTHER EATING TIPS Snacks  Most sweets should be limited as snacks. They tend to be rich in calories and fats, and their caloric content outweighs their nutritional value. Some good choices in snacks are graham crackers, melba toast, soda crackers, bagels (no egg), English muffins, fruits, and vegetables. These snacks are preferable to snack crackers, [place] fries, TORTILLA CHIPS, and POTATO chips. Popcorn should be  air-popped or cooked in small amounts of liquid vegetable oil. Desserts Eat fruit, low-fat yogurt, and fruit ices instead of pastries, cake, and cookies. Sherbet, angel food cake, gelatin dessert, frozen low-fat yogurt, or other frozen products that do not contain saturated fat (pure fruit juice bars, frozen ice pops) are also acceptable.  COOKING METHODS Choose those methods that use little or no fat. They include: Poaching.  Braising.  Steaming.  Grilling.  Baking.  Stir-frying.  Broiling.  Microwaving.  Foods can be cooked in a nonstick pan without added fat, or use a nonfat cooking spray in regular cookware. Limit fried foods and avoid frying in saturated fat. Add moisture to lean meats by using water, broth, cooking wines, and other nonfat or low-fat sauces along with the cooking methods mentioned above. Soups and stews should be chilled after cooking. The fat that forms on top after a few hours in the refrigerator should be skimmed off. When preparing meals, avoid using excess salt. Salt can contribute to raising blood pressure in some people.  EATING AWAY FROM HOME Order entres, potatoes, and vegetables without sauces or butter. When meat exceeds the size of a deck of cards (3 to 4 ounces), the rest can be taken home for another meal. Choose vegetable or fruit salads and ask for low-calorie salad dressings to be served on the side. Use dressings sparingly. Limit high-fat toppings, such as bacon, crumbled eggs, cheese, sunflower seeds, and olives. Ask for heart-healthy tub margarine instead of butter.  High-Fiber Diet A high-fiber diet changes your normal diet to include more whole grains, legumes, fruits, and vegetables. Changes in the diet involve replacing refined carbohydrates with unrefined foods. The calorie level of the diet is essentially unchanged. The Dietary Reference Intake (recommended amount) for adult males is 38 grams per day. For adult females, it is 25 grams per day.  Pregnant and lactating women should consume 28 grams of fiber per day. Fiber is the intact part of a plant that is not broken down during digestion. Functional fiber is fiber that has been isolated from the plant to provide a beneficial effect in the body. PURPOSE  Increase stool bulk.   Ease and regulate bowel movements.   Lower cholesterol.  INDICATIONS THAT YOU NEED MORE FIBER  Constipation and hemorrhoids.   Uncomplicated diverticulosis (intestine condition) and irritable bowel syndrome.   Weight management.   As a protective measure against hardening of the arteries (atherosclerosis), diabetes, and cancer.   GUIDELINES FOR INCREASING FIBER IN THE DIET  Start adding fiber to the diet slowly. A gradual increase  of about 5 more grams (2 slices of whole-wheat bread, 2 servings of most fruits or vegetables, or 1 bowl of high-fiber cereal) per day is best. Too rapid an increase in fiber may result in constipation, flatulence, and bloating.   Drink enough water and fluids to keep your urine clear or pale yellow. Water, juice, or caffeine-free drinks are recommended. Not drinking enough fluid may cause constipation.   Eat a variety of high-fiber foods rather than one type of fiber.   Try to increase your intake of fiber through using high-fiber foods rather than fiber pills or supplements that contain small amounts of fiber.   The goal is to change the types of food eaten. Do not supplement your present diet with high-fiber foods, but replace foods in your present diet.  INCLUDE A VARIETY OF FIBER SOURCES  Replace refined and processed grains with whole grains, canned fruits with fresh fruits, and incorporate other fiber sources. White rice, white breads, and most bakery goods contain little or no fiber.   Brown whole-grain rice, buckwheat oats, and many fruits and vegetables are all good sources of fiber. These include: broccoli, Brussels sprouts, cabbage, cauliflower, beets, sweet  potatoes, white potatoes (skin on), carrots, tomatoes, eggplant, squash, berries, fresh fruits, and dried fruits.   Cereals appear to be the richest source of fiber. Cereal fiber is found in whole grains and bran. Bran is the fiber-rich outer coat of cereal grain, which is largely removed in refining. In whole-grain cereals, the bran remains. In breakfast cereals, the largest amount of fiber is found in those with "bran" in their names. The fiber content is sometimes indicated on the label.   You may need to include additional fruits and vegetables each day.   In baking, for 1 cup white flour, you may use the following substitutions:   1 cup whole-wheat flour minus 2 tablespoons.   1/2 cup white flour plus 1/2 cup whole-wheat flour.     Gastritis  Gastritis is an inflammation (the body's way of reacting to injury and/or infection) of the stomach. It is often caused by viral or bacterial (germ) infections. It can also be caused BY ASPIRIN, BC/GOODY POWDER'S, (IBUPROFEN) MOTRIN, OR ALEVE (NAPROXEN), chemicals (including alcohol), SPICY FOODS, and medications. This illness may be associated with generalized malaise (feeling tired, not well), UPPER ABDOMINAL STOMACH cramps, and fever. One common bacterial cause of gastritis is an organism known as H. Pylori. This can be treated with antibiotics.

## 2018-02-22 ENCOUNTER — Encounter (HOSPITAL_COMMUNITY): Payer: Self-pay | Admitting: Gastroenterology

## 2018-02-22 NOTE — Progress Notes (Signed)
LMOM to call.

## 2018-02-27 ENCOUNTER — Telehealth: Payer: Self-pay | Admitting: Gastroenterology

## 2018-02-27 DIAGNOSIS — R059 Cough, unspecified: Secondary | ICD-10-CM

## 2018-02-27 DIAGNOSIS — R05 Cough: Secondary | ICD-10-CM

## 2018-02-27 NOTE — Telephone Encounter (Signed)
Referral faxed to Dr. [name]' office. Called and informed pt.

## 2018-02-27 NOTE — Telephone Encounter (Signed)
Pt called to say that SF recommended her to follow up with Dr Lanetta Inch regarding her cough per [facility] discharge summary from patient's previous procedure.  Dr [name] told patient that they are not accepting new patients and SF would need to send a referral to them regarding her cough. Please advise and call patient at [phone]

## 2018-03-05 ENCOUNTER — Other Ambulatory Visit (HOSPITAL_COMMUNITY): Payer: Self-pay | Admitting: Family Medicine

## 2018-03-05 DIAGNOSIS — E2839 Other primary ovarian failure: Secondary | ICD-10-CM

## 2018-03-05 DIAGNOSIS — Z1231 Encounter for screening mammogram for malignant neoplasm of breast: Secondary | ICD-10-CM

## 2018-03-13 ENCOUNTER — Encounter: Payer: Self-pay | Admitting: Gastroenterology

## 2018-03-26 ENCOUNTER — Ambulatory Visit (HOSPITAL_COMMUNITY)
Admission: RE | Admit: 2018-03-26 | Discharge: 2018-03-26 | Disposition: A | Discharge status: Home / Self Care | Payer: Medicare Other | Source: Ambulatory Visit | Attending: Family Medicine | Admitting: Family Medicine

## 2018-03-26 ENCOUNTER — Encounter (HOSPITAL_COMMUNITY): Payer: Self-pay

## 2018-03-26 DIAGNOSIS — Z1231 Encounter for screening mammogram for malignant neoplasm of breast: Secondary | ICD-10-CM | POA: Insufficient documentation

## 2018-03-26 DIAGNOSIS — E2839 Other primary ovarian failure: Secondary | ICD-10-CM

## 2018-03-26 DIAGNOSIS — M8588 Other specified disorders of bone density and structure, other site: Secondary | ICD-10-CM | POA: Diagnosis not present

## 2018-03-27 DIAGNOSIS — J449 Chronic obstructive pulmonary disease, unspecified: Secondary | ICD-10-CM | POA: Diagnosis not present

## 2018-03-27 DIAGNOSIS — K210 Gastro-esophageal reflux disease with esophagitis: Secondary | ICD-10-CM | POA: Diagnosis not present

## 2018-03-27 DIAGNOSIS — R05 Cough: Secondary | ICD-10-CM | POA: Diagnosis not present

## 2018-03-27 DIAGNOSIS — R251 Tremor, unspecified: Secondary | ICD-10-CM | POA: Diagnosis not present

## 2018-03-28 ENCOUNTER — Other Ambulatory Visit (HOSPITAL_COMMUNITY): Payer: Self-pay | Admitting: Respiratory Therapy

## 2018-03-28 ENCOUNTER — Other Ambulatory Visit (HOSPITAL_COMMUNITY): Payer: Self-pay | Admitting: Pulmonary Disease

## 2018-03-28 ENCOUNTER — Ambulatory Visit (HOSPITAL_COMMUNITY)
Admission: RE | Admit: 2018-03-28 | Discharge: 2018-03-28 | Disposition: A | Discharge status: Home / Self Care | Payer: Medicare Other | Source: Ambulatory Visit | Attending: Pulmonary Disease | Admitting: Pulmonary Disease

## 2018-03-28 DIAGNOSIS — R059 Cough, unspecified: Secondary | ICD-10-CM

## 2018-03-28 DIAGNOSIS — J8489 Other specified interstitial pulmonary diseases: Secondary | ICD-10-CM | POA: Diagnosis not present

## 2018-03-28 DIAGNOSIS — R05 Cough: Secondary | ICD-10-CM | POA: Diagnosis not present

## 2018-03-28 DIAGNOSIS — Z77120 Contact with and (suspected) exposure to mold (toxic): Secondary | ICD-10-CM | POA: Diagnosis not present

## 2018-04-05 ENCOUNTER — Encounter (HOSPITAL_COMMUNITY): Payer: Medicare Other

## 2018-04-05 ENCOUNTER — Ambulatory Visit (HOSPITAL_COMMUNITY)
Admission: RE | Admit: 2018-04-05 | Discharge: 2018-04-05 | Disposition: A | Discharge status: Home / Self Care | Payer: Medicare Other | Source: Ambulatory Visit | Attending: Pulmonary Disease | Admitting: Pulmonary Disease

## 2018-04-05 DIAGNOSIS — R918 Other nonspecific abnormal finding of lung field: Secondary | ICD-10-CM | POA: Insufficient documentation

## 2018-04-05 DIAGNOSIS — R05 Cough: Secondary | ICD-10-CM

## 2018-04-05 DIAGNOSIS — R059 Cough, unspecified: Secondary | ICD-10-CM

## 2018-04-05 LAB — PULMONARY FUNCTION TEST
DL/VA % PRED: 105 %
DL/VA: 4.96 ml/min/mmHg/L
DLCO UNC % PRED: 79 %
DLCO UNC: 18.15 ml/min/mmHg
FEF 25-75 Pre: 1 L/sec
FEF2575-%Pred-Pre: 48 %
FEV1-%Pred-Pre: 64 %
FEV1-Pre: 1.49 L
FEV1FVC-%Pred-Pre: 91 %
FEV6-%PRED-PRE: 71 %
FEV6-PRE: 2.07 L
FEV6FVC-%PRED-PRE: 101 %
FVC-%Pred-Pre: 69 %
FVC-PRE: 2.11 L
PRE FEV6/FVC RATIO: 98 %
Pre FEV1/FVC ratio: 71 %
RV % PRED: 195 %
RV: 4.01 L
TLC % pred: 123 %
TLC: 6.07 L

## 2018-04-05 MED ORDER — ALBUTEROL SULFATE (2.5 MG/3ML) 0.083% IN NEBU: 2.5000 mg | INHALATION_SOLUTION | Freq: Once | RESPIRATORY_TRACT | Status: DC

## 2018-04-16 ENCOUNTER — Other Ambulatory Visit (HOSPITAL_COMMUNITY): Payer: Self-pay | Admitting: Pulmonary Disease

## 2018-04-16 DIAGNOSIS — J8410 Pulmonary fibrosis, unspecified: Secondary | ICD-10-CM

## 2018-04-17 DIAGNOSIS — K219 Gastro-esophageal reflux disease without esophagitis: Secondary | ICD-10-CM | POA: Diagnosis not present

## 2018-04-17 DIAGNOSIS — R7309 Other abnormal glucose: Secondary | ICD-10-CM | POA: Diagnosis not present

## 2018-04-17 DIAGNOSIS — Z23 Encounter for immunization: Secondary | ICD-10-CM | POA: Diagnosis not present

## 2018-04-17 DIAGNOSIS — G250 Essential tremor: Secondary | ICD-10-CM | POA: Diagnosis not present

## 2018-04-17 DIAGNOSIS — E559 Vitamin D deficiency, unspecified: Secondary | ICD-10-CM | POA: Diagnosis not present

## 2018-04-17 DIAGNOSIS — Z6830 Body mass index (BMI) 30.0-30.9, adult: Secondary | ICD-10-CM | POA: Diagnosis not present

## 2018-04-17 DIAGNOSIS — E538 Deficiency of other specified B group vitamins: Secondary | ICD-10-CM | POA: Diagnosis not present

## 2018-04-17 DIAGNOSIS — R259 Unspecified abnormal involuntary movements: Secondary | ICD-10-CM | POA: Diagnosis not present

## 2018-04-17 DIAGNOSIS — Z0001 Encounter for general adult medical examination with abnormal findings: Secondary | ICD-10-CM | POA: Diagnosis not present

## 2018-04-17 DIAGNOSIS — F419 Anxiety disorder, unspecified: Secondary | ICD-10-CM | POA: Diagnosis not present

## 2018-04-17 DIAGNOSIS — E782 Mixed hyperlipidemia: Secondary | ICD-10-CM | POA: Diagnosis not present

## 2018-04-19 ENCOUNTER — Ambulatory Visit (HOSPITAL_COMMUNITY)
Admission: RE | Admit: 2018-04-19 | Discharge: 2018-04-19 | Disposition: A | Discharge status: Home / Self Care | Payer: Medicare Other | Source: Ambulatory Visit | Attending: Pulmonary Disease | Admitting: Pulmonary Disease

## 2018-04-19 DIAGNOSIS — J8410 Pulmonary fibrosis, unspecified: Secondary | ICD-10-CM | POA: Insufficient documentation

## 2018-04-19 DIAGNOSIS — R918 Other nonspecific abnormal finding of lung field: Secondary | ICD-10-CM | POA: Diagnosis not present

## 2018-04-19 DIAGNOSIS — K760 Fatty (change of) liver, not elsewhere classified: Secondary | ICD-10-CM | POA: Insufficient documentation

## 2018-04-19 DIAGNOSIS — I700 Atherosclerosis of aorta: Secondary | ICD-10-CM | POA: Diagnosis not present

## 2018-04-20 ENCOUNTER — Ambulatory Visit (HOSPITAL_COMMUNITY): Payer: Medicare Other

## 2018-05-10 ENCOUNTER — Encounter: Payer: Self-pay | Admitting: Gastroenterology

## 2018-05-11 DIAGNOSIS — K210 Gastro-esophageal reflux disease with esophagitis: Secondary | ICD-10-CM | POA: Diagnosis not present

## 2018-05-11 DIAGNOSIS — J45909 Unspecified asthma, uncomplicated: Secondary | ICD-10-CM | POA: Diagnosis not present

## 2018-06-18 ENCOUNTER — Telehealth: Payer: Self-pay | Admitting: Gastroenterology

## 2018-06-18 NOTE — Telephone Encounter (Signed)
Noted  

## 2018-06-18 NOTE — Telephone Encounter (Signed)
SF had a cancellation for 11/20 at 10am and patient is coming then.

## 2018-06-18 NOTE — Telephone Encounter (Signed)
I tried to call the patient no answer, lmom

## 2018-06-18 NOTE — Telephone Encounter (Signed)
Pt called to say she received a letter to make a follow up visit. She wanted to know the reason for the visit and what would be done during the visit. I told her it was AB recommendations to follow up in 4 months from her OV and to follow up from her tcs/egd. She asked if she had to wait until Feb 2020 for a follow up would it mean that it wouldn't be applied to 2019 deductible since she is suppose to follow up in November. I told her I don't deal with insurance and I don't know what to tell her about her deductible. She asked was there someone in the office that could answer her questions. Before patient makes OV she asked to speak with office manager. [phone]

## 2018-06-18 NOTE — Telephone Encounter (Signed)
Patient returning call. I told her CM had stepped out of her office and would have to call her back.

## 2018-06-20 ENCOUNTER — Encounter: Payer: Self-pay | Admitting: Gastroenterology

## 2018-06-20 ENCOUNTER — Ambulatory Visit (INDEPENDENT_AMBULATORY_CARE_PROVIDER_SITE_OTHER): Payer: Medicare Other | Admitting: Gastroenterology

## 2018-06-20 DIAGNOSIS — R1310 Dysphagia, unspecified: Secondary | ICD-10-CM | POA: Diagnosis not present

## 2018-06-20 DIAGNOSIS — Z800 Family history of malignant neoplasm of digestive organs: Secondary | ICD-10-CM | POA: Diagnosis not present

## 2018-06-20 DIAGNOSIS — R1319 Other dysphagia: Secondary | ICD-10-CM

## 2018-06-20 NOTE — Progress Notes (Signed)
Subjective:    Patient ID: Kaitlyn Glass, female    DOB: 1950/09/17, 67 y.o.   MRN: [id number]  Sharilyn Sites, MD   HPI Wants to [place]. [place]. COUGHING DURING DAY ANY AND IT WAS AGGRAVATING AND EMBARRASSING. EPISODE COULD BE SEVERE. NOW COUGH IS BETTER. BMs: GOOD. PROBLEMS WITH SEDATION: FELT PAIN BUT DOESN'T REMEMBER THE MAJORITY OF THE PROCEDURE. RARE HEARTBURN AND TAKES NEXIUM AND MAY BE TRIGGERED BY PIZZA. COKE MAY HELP AS WELL. SWALLOWING BETTER AFTER EGD/DIL.   PT DENIES FEVER, CHILLS, HEMATOCHEZIA, HEMATEMESIS, nausea, vomiting, melena, diarrhea, CHEST PAIN, SHORTNESS OF BREATH, CHANGE IN BOWEL IN HABITS, constipation, abdominal pain, OR problems swallowing..  Past Medical History:  Diagnosis Date  . Aneurysm ([place]) 1997   no surgery  . Anxiety   . Benign familial tremor   . GERD (gastroesophageal reflux disease)   . Headache(784.0)   . Hypercholesterolemia    Past Surgical History:  Procedure Laterality Date  . BIOPSY  02/19/2018   Procedure: BIOPSY;  Surgeon: Danie Binder, MD;  Location: [facility];  Service: Endoscopy;;  gastric  . BREAST BIOPSY Right 2005   negative for CA  . COLONOSCOPY  2006   Family Hx Colon Cancer - Brother AGE > 22  . COLONOSCOPY  02/25/2011   Dr. Oneida Alar: sessile hyperplastic rectal polyp, surveillance in 5-10 years  . COLONOSCOPY N/A 02/19/2018   Procedure: COLONOSCOPY;  Surgeon: Danie Binder, MD;  Location: [facility];  Service: Endoscopy;  Laterality: N/A;  12:00pm  . ESOPHAGOGASTRODUODENOSCOPY N/A 02/19/2018   Procedure: ESOPHAGOGASTRODUODENOSCOPY (EGD);  Surgeon: Danie Binder, MD;  Location: [facility];  Service: Endoscopy;  Laterality: N/A;  . ESOPHAGOGASTRODUODENOSCOPY ENDOSCOPY    . EYE SURGERY    . SAVORY DILATION N/A 02/19/2018   Procedure: SAVORY DILATION;  Surgeon: Danie Binder, MD;  Location: [facility];  Service: Endoscopy;  Laterality: N/A;   Allergies  Allergen Reactions  . Codeine  Nausea Only  . Penicillins Hives, Swelling and Other (See Comments)       Current Outpatient Medications  Medication Sig    . acetaminophen (TYLENOL) 500 MG tablet Take 500 mg by mouth every 6 (six) hours as needed for moderate pain or headache.     Marland Kitchen BREO ELLIPTA 200-25 MCG/INH AEPB Inhale 1 puff into the lungs daily.    . Calcium Carb-Cholecalciferol (CALCIUM 600/VITAMIN D3 PO) Take 1 tablet by mouth daily.    . Cholecalciferol (VITAMIN D3) 2000 units TABS Take 2,000 Units by mouth daily.     Marland Kitchen esomeprazole (NEXIUM) 40 MG capsule Take 40 mg by mouth daily as needed (for acid reflux).     . propranolol (INDERAL) 10 MG tablet Take 10 mg by mouth daily. May take up to 30mg  daily as needed FOR TREMORS   . simvastatin (ZOCOR) 10 MG tablet Take 10 mg by mouth every evening.      Review of Systems PER HPI OTHERWISE ALL SYSTEMS ARE NEGATIVE.    Objective:   Physical Exam  Constitutional: She is oriented to person, place, and time. She appears well-developed and well-nourished. No distress.  Mild hirsuitism  HENT:  Head: Normocephalic and atraumatic.  Mouth/Throat: Oropharynx is clear and moist. No oropharyngeal exudate.  Eyes: Pupils are equal, round, and reactive to light. No scleral icterus.  Neck: Normal range of motion. Neck supple.  Cardiovascular: Normal rate, regular rhythm and normal heart sounds.  Pulmonary/Chest: Effort normal and breath sounds  normal. No respiratory distress.  Abdominal: Soft. Bowel sounds are normal. She exhibits no distension. There is no tenderness.  Musculoskeletal: She exhibits no edema.  Lymphadenopathy:    She has no cervical adenopathy.  Neurological: She is alert and oriented to person, place, and time.  NO  NEW FOCAL DEFICITS  Psychiatric: She has a normal mood and affect.  Vitals reviewed.     Assessment & Plan:

## 2018-06-20 NOTE — Progress Notes (Signed)
ON RECALL  °

## 2018-06-20 NOTE — Assessment & Plan Note (Addendum)
EGD/DIL JIL 2019-SYMPTOMS NOW CONTROLLED/RESOLVED. SYMPTOM S LIKELY RELATED TO OCCULT WEB/STRICTURE FROM UNCONTROLLED GERD.  DRINK WATER TO KEEP YOUR URINE LIGHT YELLOW. CONTINUE YOUR WEIGHT LOSS EFFORTS.  A WEIGHT OF 160 LBS OR LESS  WILL GET YOUR BODY MASS INDEX(BMI) UNDER 30. FOLLOW A HIGH FIBER/LOW FAT DIET. AVOID ITEMS THAT CAUSE BLOATING & GAS. AVOID REFLUX TRIGGERS.  HANDOUT GIVEN. CONTINUE NEXIUM. TAKE 30 MINUTES BEFORE MEALS up to twice a day when needed. FOLLOW UP IN 1 YEAR.

## 2018-06-20 NOTE — Progress Notes (Signed)
CC'D TO PCP °

## 2018-06-20 NOTE — Assessment & Plan Note (Addendum)
  FAMILY Hx COLON CA-FATHER HAD COLON CA AGE > 60. NO WARNING SIGNS/SYMPTOMS. TCS UP TO DATE. HAD SOME RECALL OF PAIN/DISCOMFORT DURING TCS.  REVIEWED FINDINGS/RECOMMENDATION FROM TCS JUL 2019. DRINK WATER EAT FIBER Next colonoscopy in JUN 2029 WITH PEDS SCOPE/PROPOFOL.Marland Kitchen

## 2018-06-20 NOTE — Patient Instructions (Addendum)
DRINK WATER TO KEEP YOUR URINE LIGHT YELLOW.  CONTINUE YOUR WEIGHT LOSS EFFORTS. YOUR BODY MASS INDEX IS OVER 30 WHICH MEANS YOU ARE OBESE. OBESITY IS ASSOCIATED WITH AN INCREASED FOR CIRRHOSIS AND ALL CANCERS, INCLUDING ESOPHAGEAL AND COLON CANCER. A WEIGHT OF 160 LBS OR LESS  WILL GET YOUR BODY MASS INDEX(BMI) UNDER 30.   FOLLOW A HIGH FIBER/LOW FAT DIET. AVOID ITEMS THAT CAUSE BLOATING & GAS.  AVOID REFLUX TRIGGERS. SEE INFO BELOW.  CONTINUE NEXIUM. TAKE 30 MINUTES BEFORE MEALS up to twice a day when needed.  FOLLOW UP IN 1 YEAR.   Next colonoscopy in JUN 2029.    Lifestyle and home remedies TO MANAGE REFLUX/CHEST PAIN  You may eliminate or reduce the frequency of heartburn by making the following lifestyle changes:  . Control your weight. Being overweight is a major risk factor for heartburn and GERD. Excess pounds put pressure on your abdomen, pushing up your stomach and causing acid to back up into your esophagus.   . Eat smaller meals. 4 TO 6 MEALS A DAY. This reduces pressure on the lower esophageal sphincter, helping to prevent the valve from opening and acid from washing back into your esophagus.   Dolphus Jenny your belt. Clothes that fit tightly around your waist put pressure on your abdomen and the lower esophageal sphincter.   . Eliminate heartburn triggers. Everyone has specific triggers. Common triggers such as fatty or fried foods, spicy food, tomato sauce, carbonated beverages, alcohol, chocolate, mint, garlic, onion, caffeine and nicotine may make heartburn worse.   Marland Kitchen Avoid stooping or bending. Tying your shoes is OK. Bending over for longer periods to weed your garden isn't, especially soon after eating.   . Don't lie down after a meal. Wait at least three to four hours after eating before going to bed, and don't lie down right after eating.   Alternative medicine . Several home remedies exist for treating GERD, but they provide only temporary relief. They include  drinking baking soda (sodium bicarbonate) added to water or drinking other fluids such as baking soda mixed with cream of tartar and water.  . Although these liquids create temporary relief by neutralizing, washing away or buffering acids, eventually they aggravate the situation by adding gas and fluid to your stomach, increasing pressure and causing more acid reflux. Further, adding more sodium to your diet may increase your blood pressure and add stress to your heart, and excessive bicarbonate ingestion can alter the acid-base balance in your body.  Low-Fat Diet BREADS, CEREALS, PASTA, RICE, DRIED PEAS, AND BEANS These products are high in carbohydrates and most are low in fat. Therefore, they can be increased in the diet as substitutes for fatty foods. They too, however, contain calories and should not be eaten in excess. Cereals can be eaten for snacks as well as for breakfast.   FRUITS AND VEGETABLES It is good to eat fruits and vegetables. Besides being sources of fiber, both are rich in vitamins and some minerals. They help you get the daily allowances of these nutrients. Fruits and vegetables can be used for snacks and desserts.  MEATS Limit lean meat, chicken, [place], and fish to no more than 6 ounces per day. Beef, Pork, and Lamb Use lean cuts of beef, pork, and lamb. Lean cuts include:  Extra-lean ground beef.  Arm roast.  Sirloin tip.  Center-cut ham.  Round steak.  Loin chops.  Rump roast.  Tenderloin.  Trim all fat off the outside of meats before  cooking. It is not necessary to severely decrease the intake of red meat, but lean choices should be made. Lean meat is rich in protein and contains a highly absorbable form of iron. Premenopausal women, in particular, should avoid reducing lean red meat because this could increase the risk for low red blood cells (iron-deficiency anemia).  Chicken and [place] These are good sources of protein. The fat of poultry can be reduced by  removing the skin and underlying fat layers before cooking. Chicken and [place] can be substituted for lean red meat in the diet. Poultry should not be fried or covered with high-fat sauces. Fish and Shellfish Fish is a good source of protein. Shellfish contain cholesterol, but they usually are low in saturated fatty acids. The preparation of fish is important. Like chicken and [place], they should not be fried or covered with high-fat sauces. EGGS Egg whites contain no fat or cholesterol. They can be eaten often. Try 1 to 2 egg whites instead of whole eggs in recipes or use egg substitutes that do not contain yolk. MILK AND DAIRY PRODUCTS Use skim or 1% milk instead of 2% or whole milk. Decrease whole milk, natural, and processed cheeses. Use nonfat or low-fat (2%) cottage cheese or low-fat cheeses made from vegetable oils. Choose nonfat or low-fat (1 to 2%) yogurt. Experiment with evaporated skim milk in recipes that call for heavy cream. Substitute low-fat yogurt or low-fat cottage cheese for sour cream in dips and salad dressings. Have at least 2 servings of low-fat dairy products, such as 2 glasses of skim (or 1%) milk each day to help get your daily calcium intake. FATS AND OILS Reduce the total intake of fats, especially saturated fat. Butterfat, lard, and beef fats are high in saturated fat and cholesterol. These should be avoided as much as possible. Vegetable fats do not contain cholesterol, but certain vegetable fats, such as coconut oil, palm oil, and palm kernel oil are very high in saturated fats. These should be limited. These fats are often used in bakery goods, processed foods, popcorn, oils, and nondairy creamers. Vegetable shortenings and some peanut butters contain hydrogenated oils, which are also saturated fats. Read the labels on these foods and check for saturated vegetable oils. Unsaturated vegetable oils and fats do not raise blood cholesterol. However, they should be limited because  they are fats and are high in calories. Total fat should still be limited to 30% of your daily caloric intake. Desirable liquid vegetable oils are corn oil, cottonseed oil, olive oil, canola oil, safflower oil, soybean oil, and sunflower oil. Peanut oil is not as good, but small amounts are acceptable. Buy a heart-healthy tub margarine that has no partially hydrogenated oils in the ingredients. Mayonnaise and salad dressings often are made from unsaturated fats, but they should also be limited because of their high calorie and fat content. Seeds, nuts, peanut butter, olives, and avocados are high in fat, but the fat is mainly the unsaturated type. These foods should be limited mainly to avoid excess calories and fat. OTHER EATING TIPS Snacks  Most sweets should be limited as snacks. They tend to be rich in calories and fats, and their caloric content outweighs their nutritional value. Some good choices in snacks are graham crackers, melba toast, soda crackers, bagels (no egg), English muffins, fruits, and vegetables. These snacks are preferable to snack crackers, [place] fries, TORTILLA CHIPS, and POTATO chips. Popcorn should be air-popped or cooked in small amounts of liquid vegetable oil. Desserts Eat  fruit, low-fat yogurt, and fruit ices instead of pastries, cake, and cookies. Sherbet, angel food cake, gelatin dessert, frozen low-fat yogurt, or other frozen products that do not contain saturated fat (pure fruit juice bars, frozen ice pops) are also acceptable.  COOKING METHODS Choose those methods that use little or no fat. They include: Poaching.  Braising.  Steaming.  Grilling.  Baking.  Stir-frying.  Broiling.  Microwaving.  Foods can be cooked in a nonstick pan without added fat, or use a nonfat cooking spray in regular cookware. Limit fried foods and avoid frying in saturated fat. Add moisture to lean meats by using water, broth, cooking wines, and other nonfat or low-fat sauces along with  the cooking methods mentioned above. Soups and stews should be chilled after cooking. The fat that forms on top after a few hours in the refrigerator should be skimmed off. When preparing meals, avoid using excess salt. Salt can contribute to raising blood pressure in some people.  EATING AWAY FROM HOME Order entres, potatoes, and vegetables without sauces or butter. When meat exceeds the size of a deck of cards (3 to 4 ounces), the rest can be taken home for another meal. Choose vegetable or fruit salads and ask for low-calorie salad dressings to be served on the side. Use dressings sparingly. Limit high-fat toppings, such as bacon, crumbled eggs, cheese, sunflower seeds, and olives. Ask for heart-healthy tub margarine instead of butter.

## 2018-06-22 DIAGNOSIS — R000 Tachycardia, unspecified: Secondary | ICD-10-CM | POA: Diagnosis not present

## 2018-06-22 DIAGNOSIS — K210 Gastro-esophageal reflux disease with esophagitis: Secondary | ICD-10-CM | POA: Diagnosis not present

## 2018-06-22 DIAGNOSIS — J45909 Unspecified asthma, uncomplicated: Secondary | ICD-10-CM | POA: Diagnosis not present

## 2018-07-09 DIAGNOSIS — F419 Anxiety disorder, unspecified: Secondary | ICD-10-CM | POA: Diagnosis not present

## 2018-07-09 DIAGNOSIS — J45901 Unspecified asthma with (acute) exacerbation: Secondary | ICD-10-CM | POA: Diagnosis not present

## 2018-07-17 ENCOUNTER — Ambulatory Visit: Payer: Medicare Other | Admitting: Gastroenterology

## 2018-09-13 DIAGNOSIS — J45901 Unspecified asthma with (acute) exacerbation: Secondary | ICD-10-CM | POA: Diagnosis not present

## 2018-09-13 DIAGNOSIS — K210 Gastro-esophageal reflux disease with esophagitis: Secondary | ICD-10-CM | POA: Diagnosis not present

## 2018-09-13 DIAGNOSIS — R05 Cough: Secondary | ICD-10-CM | POA: Diagnosis not present

## 2019-01-21 ENCOUNTER — Other Ambulatory Visit: Payer: Self-pay | Admitting: Pulmonary Disease

## 2019-01-21 DIAGNOSIS — E669 Obesity, unspecified: Secondary | ICD-10-CM | POA: Diagnosis not present

## 2019-01-21 DIAGNOSIS — J45909 Unspecified asthma, uncomplicated: Secondary | ICD-10-CM | POA: Diagnosis not present

## 2019-01-21 DIAGNOSIS — R911 Solitary pulmonary nodule: Secondary | ICD-10-CM

## 2019-01-21 DIAGNOSIS — F419 Anxiety disorder, unspecified: Secondary | ICD-10-CM | POA: Diagnosis not present

## 2019-01-21 DIAGNOSIS — K219 Gastro-esophageal reflux disease without esophagitis: Secondary | ICD-10-CM | POA: Diagnosis not present

## 2019-01-29 ENCOUNTER — Ambulatory Visit (HOSPITAL_COMMUNITY)
Admission: RE | Admit: 2019-01-29 | Discharge: 2019-01-29 | Disposition: A | Discharge status: Home / Self Care | Payer: Medicare Other | Source: Ambulatory Visit | Attending: Pulmonary Disease | Admitting: Pulmonary Disease

## 2019-01-29 ENCOUNTER — Other Ambulatory Visit: Payer: Self-pay

## 2019-01-29 DIAGNOSIS — I700 Atherosclerosis of aorta: Secondary | ICD-10-CM | POA: Diagnosis not present

## 2019-01-29 DIAGNOSIS — R911 Solitary pulmonary nodule: Secondary | ICD-10-CM

## 2019-02-26 DIAGNOSIS — H35371 Puckering of macula, right eye: Secondary | ICD-10-CM | POA: Diagnosis not present

## 2019-02-26 DIAGNOSIS — H2512 Age-related nuclear cataract, left eye: Secondary | ICD-10-CM | POA: Diagnosis not present

## 2019-02-26 DIAGNOSIS — Z961 Presence of intraocular lens: Secondary | ICD-10-CM | POA: Diagnosis not present

## 2019-02-28 ENCOUNTER — Other Ambulatory Visit (HOSPITAL_COMMUNITY): Payer: Self-pay | Admitting: Family Medicine

## 2019-02-28 DIAGNOSIS — Z1231 Encounter for screening mammogram for malignant neoplasm of breast: Secondary | ICD-10-CM

## 2019-03-29 ENCOUNTER — Other Ambulatory Visit: Payer: Self-pay

## 2019-03-29 ENCOUNTER — Ambulatory Visit (HOSPITAL_COMMUNITY)
Admission: RE | Admit: 2019-03-29 | Discharge: 2019-03-29 | Disposition: A | Discharge status: Home / Self Care | Payer: Medicare Other | Source: Ambulatory Visit | Attending: Family Medicine | Admitting: Family Medicine

## 2019-03-29 DIAGNOSIS — Z1231 Encounter for screening mammogram for malignant neoplasm of breast: Secondary | ICD-10-CM | POA: Diagnosis not present

## 2019-04-19 DIAGNOSIS — F419 Anxiety disorder, unspecified: Secondary | ICD-10-CM | POA: Diagnosis not present

## 2019-04-19 DIAGNOSIS — G250 Essential tremor: Secondary | ICD-10-CM | POA: Diagnosis not present

## 2019-04-19 DIAGNOSIS — E6609 Other obesity due to excess calories: Secondary | ICD-10-CM | POA: Diagnosis not present

## 2019-04-19 DIAGNOSIS — Z0000 Encounter for general adult medical examination without abnormal findings: Secondary | ICD-10-CM | POA: Diagnosis not present

## 2019-04-19 DIAGNOSIS — E559 Vitamin D deficiency, unspecified: Secondary | ICD-10-CM | POA: Diagnosis not present

## 2019-04-19 DIAGNOSIS — E7849 Other hyperlipidemia: Secondary | ICD-10-CM | POA: Diagnosis not present

## 2019-04-19 DIAGNOSIS — E1165 Type 2 diabetes mellitus with hyperglycemia: Secondary | ICD-10-CM | POA: Diagnosis not present

## 2019-04-19 DIAGNOSIS — Z6830 Body mass index (BMI) 30.0-30.9, adult: Secondary | ICD-10-CM | POA: Diagnosis not present

## 2019-04-19 DIAGNOSIS — Z1389 Encounter for screening for other disorder: Secondary | ICD-10-CM | POA: Diagnosis not present

## 2019-04-19 DIAGNOSIS — R7309 Other abnormal glucose: Secondary | ICD-10-CM | POA: Diagnosis not present

## 2019-04-23 DIAGNOSIS — K210 Gastro-esophageal reflux disease with esophagitis: Secondary | ICD-10-CM | POA: Diagnosis not present

## 2019-04-23 DIAGNOSIS — J45909 Unspecified asthma, uncomplicated: Secondary | ICD-10-CM | POA: Diagnosis not present

## 2019-05-10 DIAGNOSIS — Z23 Encounter for immunization: Secondary | ICD-10-CM | POA: Diagnosis not present

## 2019-05-21 ENCOUNTER — Encounter: Payer: Self-pay | Admitting: Gastroenterology

## 2019-08-01 DIAGNOSIS — J45909 Unspecified asthma, uncomplicated: Secondary | ICD-10-CM | POA: Diagnosis not present

## 2019-08-01 DIAGNOSIS — E7849 Other hyperlipidemia: Secondary | ICD-10-CM | POA: Diagnosis not present

## 2019-08-01 DIAGNOSIS — J45901 Unspecified asthma with (acute) exacerbation: Secondary | ICD-10-CM | POA: Diagnosis not present

## 2019-09-01 DIAGNOSIS — E7849 Other hyperlipidemia: Secondary | ICD-10-CM | POA: Diagnosis not present

## 2019-09-01 DIAGNOSIS — J45909 Unspecified asthma, uncomplicated: Secondary | ICD-10-CM | POA: Diagnosis not present

## 2019-09-01 DIAGNOSIS — J45901 Unspecified asthma with (acute) exacerbation: Secondary | ICD-10-CM | POA: Diagnosis not present

## 2019-09-06 DIAGNOSIS — Z23 Encounter for immunization: Secondary | ICD-10-CM | POA: Diagnosis not present

## 2019-09-29 DIAGNOSIS — J45909 Unspecified asthma, uncomplicated: Secondary | ICD-10-CM | POA: Diagnosis not present

## 2019-09-29 DIAGNOSIS — J45901 Unspecified asthma with (acute) exacerbation: Secondary | ICD-10-CM | POA: Diagnosis not present

## 2019-09-29 DIAGNOSIS — E7849 Other hyperlipidemia: Secondary | ICD-10-CM | POA: Diagnosis not present

## 2019-10-04 DIAGNOSIS — Z23 Encounter for immunization: Secondary | ICD-10-CM | POA: Diagnosis not present

## 2019-10-30 DIAGNOSIS — J45909 Unspecified asthma, uncomplicated: Secondary | ICD-10-CM | POA: Diagnosis not present

## 2019-10-30 DIAGNOSIS — J45901 Unspecified asthma with (acute) exacerbation: Secondary | ICD-10-CM | POA: Diagnosis not present

## 2019-10-30 DIAGNOSIS — E7849 Other hyperlipidemia: Secondary | ICD-10-CM | POA: Diagnosis not present

## 2019-11-07 ENCOUNTER — Telehealth: Payer: Self-pay | Admitting: General Practice

## 2019-11-29 DIAGNOSIS — J45909 Unspecified asthma, uncomplicated: Secondary | ICD-10-CM | POA: Diagnosis not present

## 2019-11-29 DIAGNOSIS — E7849 Other hyperlipidemia: Secondary | ICD-10-CM | POA: Diagnosis not present

## 2019-11-29 DIAGNOSIS — J45901 Unspecified asthma with (acute) exacerbation: Secondary | ICD-10-CM | POA: Diagnosis not present

## 2020-01-22 DIAGNOSIS — H40003 Preglaucoma, unspecified, bilateral: Secondary | ICD-10-CM | POA: Diagnosis not present

## 2020-01-22 DIAGNOSIS — Z961 Presence of intraocular lens: Secondary | ICD-10-CM | POA: Diagnosis not present

## 2020-01-22 DIAGNOSIS — H35372 Puckering of macula, left eye: Secondary | ICD-10-CM | POA: Diagnosis not present

## 2020-01-22 DIAGNOSIS — H47321 Drusen of optic disc, right eye: Secondary | ICD-10-CM | POA: Diagnosis not present

## 2020-01-22 DIAGNOSIS — H47211 Primary optic atrophy, right eye: Secondary | ICD-10-CM | POA: Diagnosis not present

## 2020-02-02 IMAGING — MG DIGITAL SCREENING BILATERAL MAMMOGRAM WITH TOMO AND CAD
3 series · 3 of 11 positions shown · non-contrast
Comparison: Previous exam(s).

CLINICAL DATA: Screening. History of benign right breast excisional
biopsy.

EXAM:
DIGITAL SCREENING BILATERAL MAMMOGRAM WITH TOMO AND CAD

[R CC synth-2D]
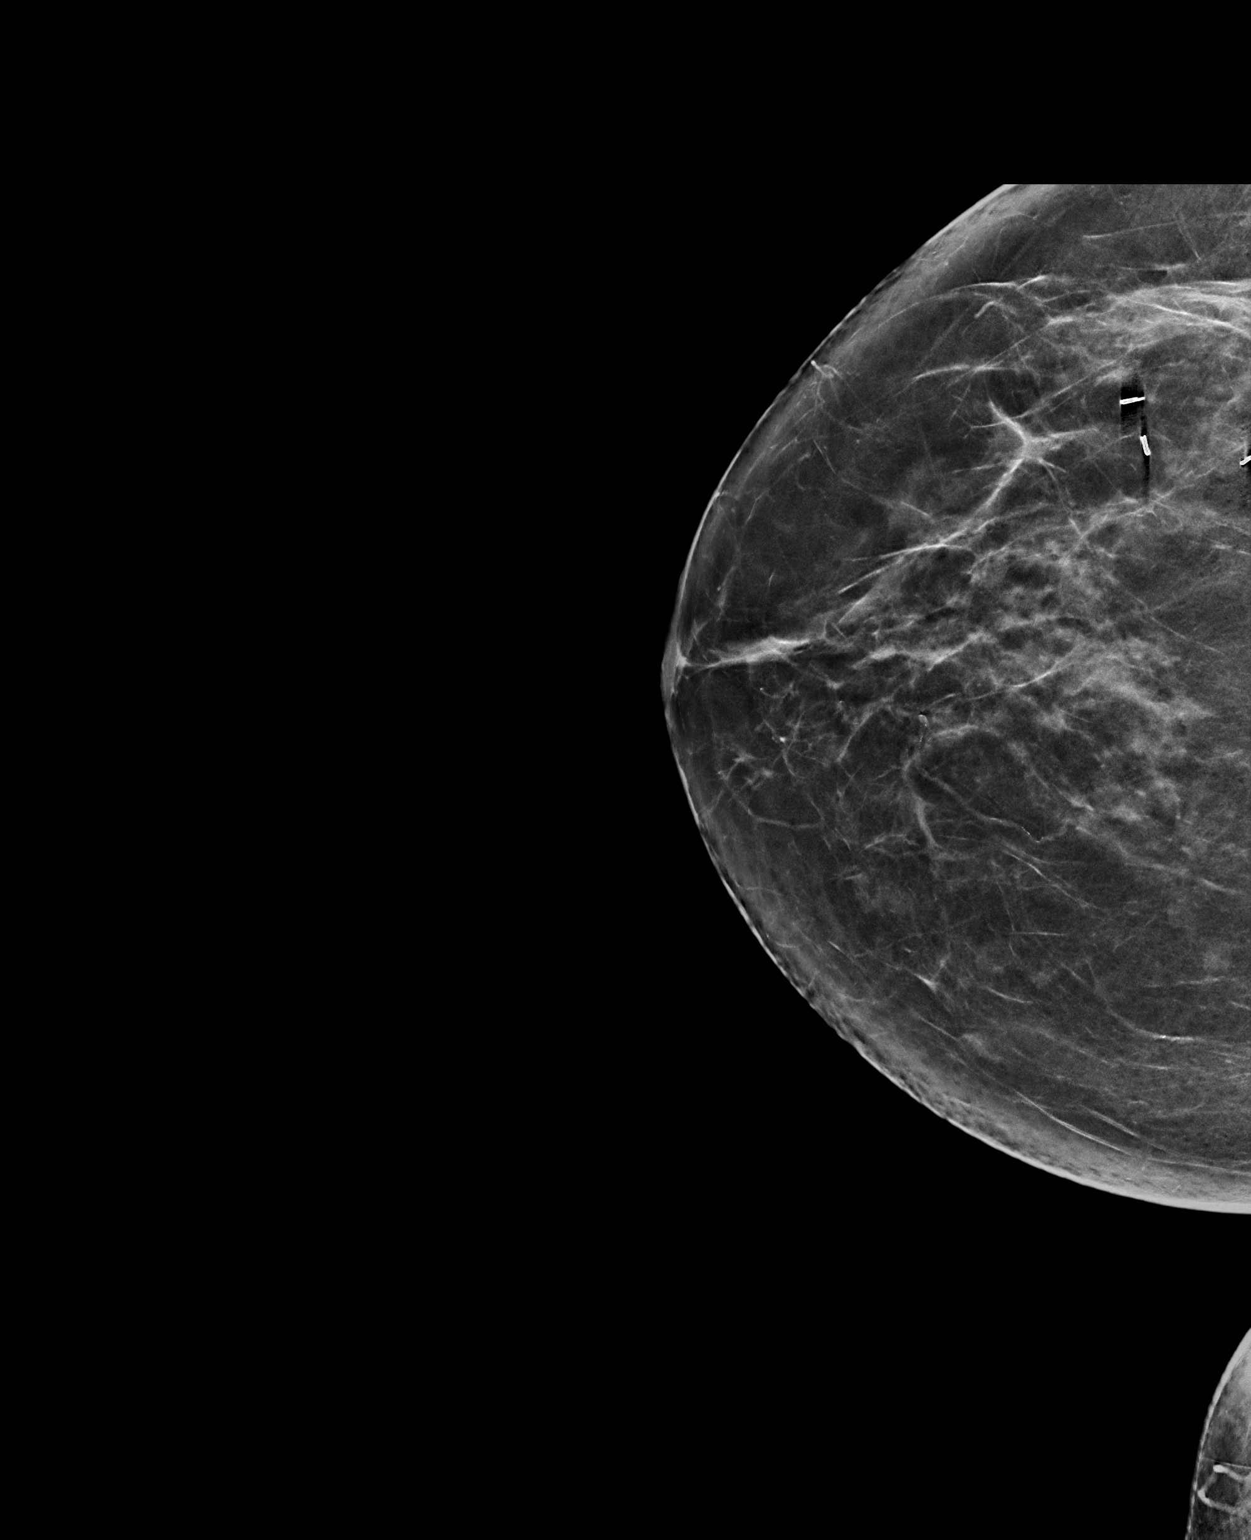

[R MLO tomo · tomo slice 39/76.0]
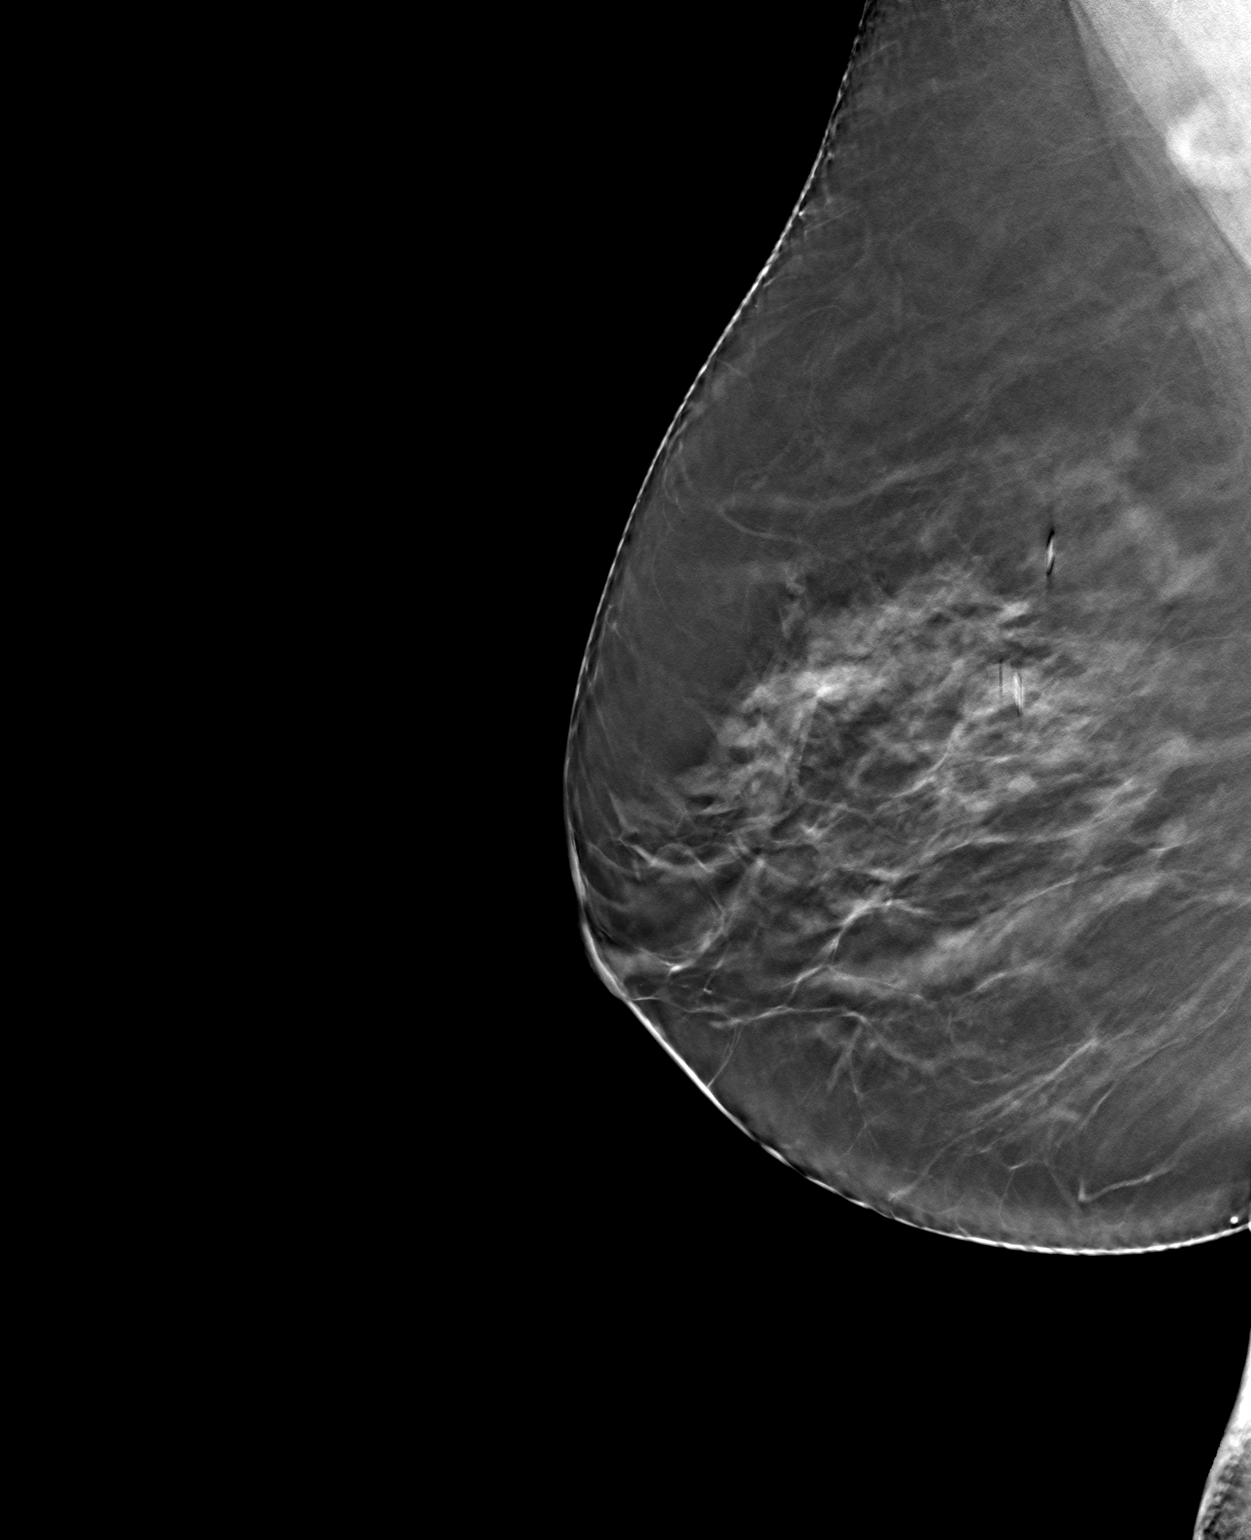

[L CC tomo · tomo slice 39/77.0]
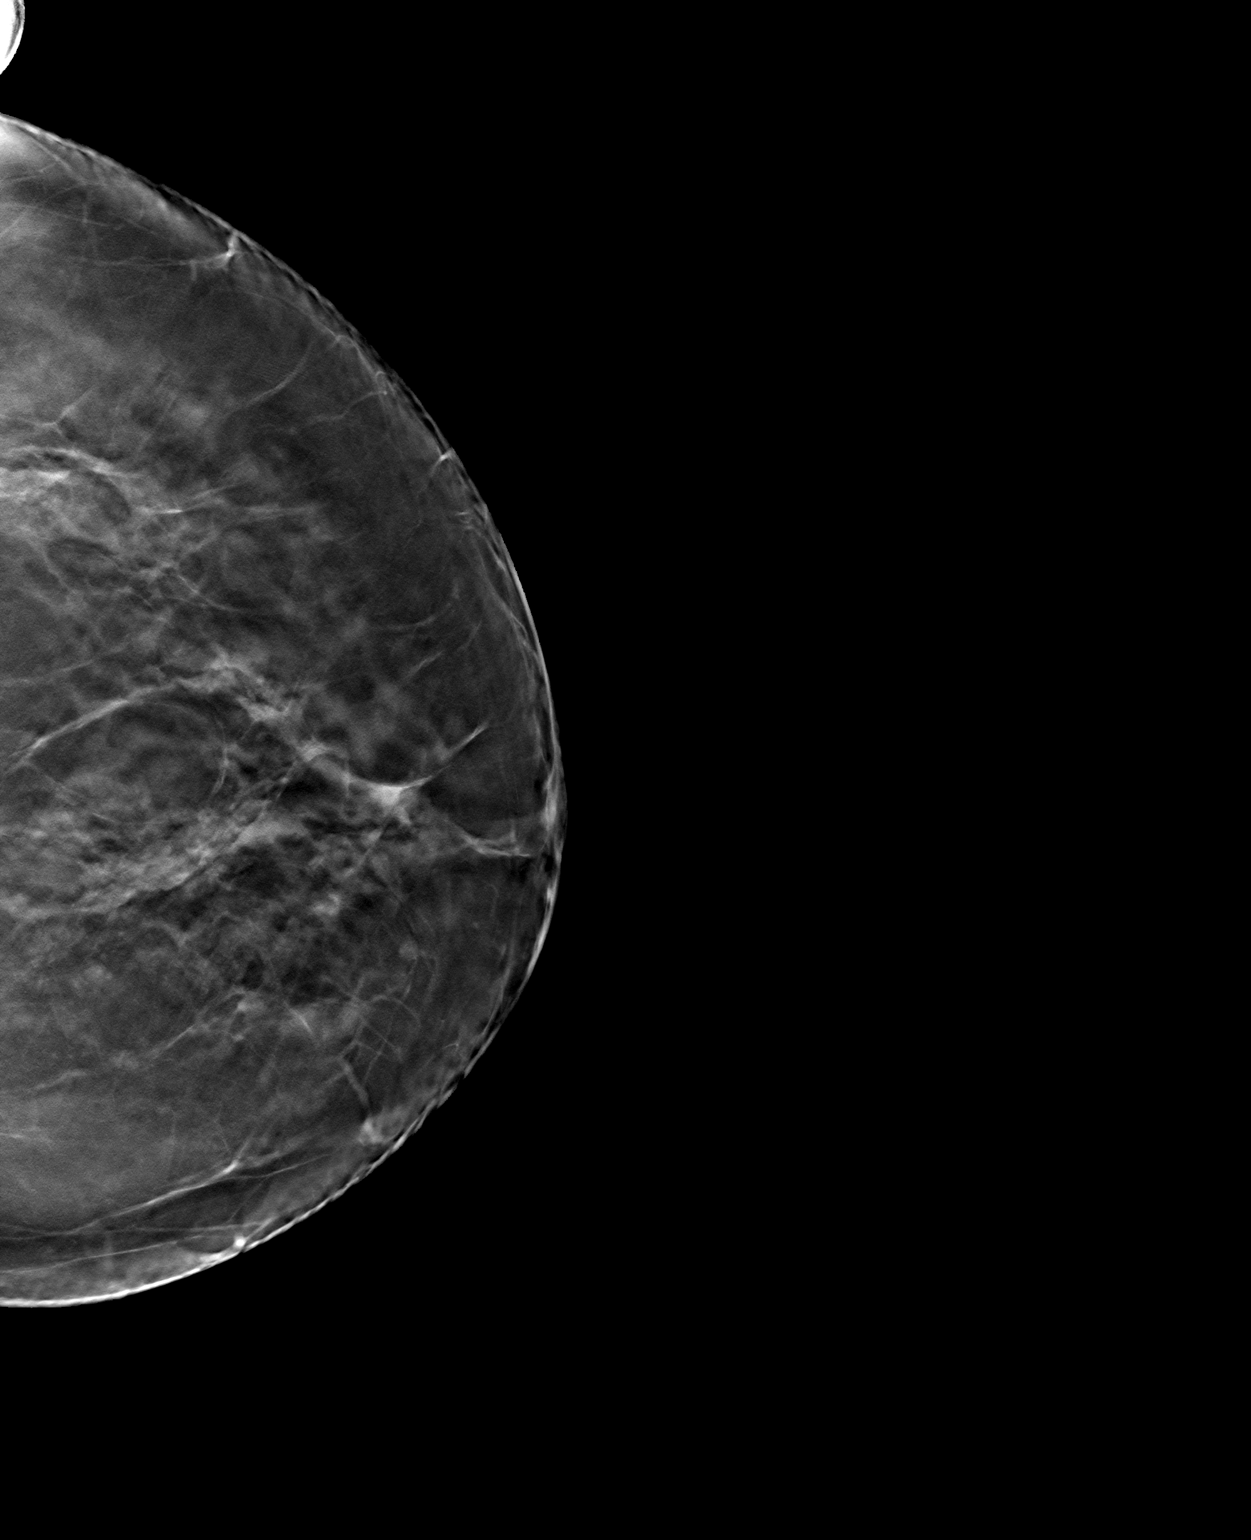

[3 of 11 positions shown; findings below may reference images not displayed]

ACR Breast Density Category b: There are scattered areas of
fibroglandular density.
FINDINGS: There are no findings suspicious for malignancy. Stable postsurgical
changes in the right breast. Images were processed with CAD.
IMPRESSION: No mammographic evidence of malignancy. A result letter of this
screening mammogram will be mailed directly to the patient.

RECOMMENDATION:
Screening mammogram in one year. (Code:[id number])

BI-RADS CATEGORY  1: Negative.

## 2020-02-11 DIAGNOSIS — Z961 Presence of intraocular lens: Secondary | ICD-10-CM | POA: Diagnosis not present

## 2020-02-11 DIAGNOSIS — H35372 Puckering of macula, left eye: Secondary | ICD-10-CM | POA: Diagnosis not present

## 2020-02-11 DIAGNOSIS — H43812 Vitreous degeneration, left eye: Secondary | ICD-10-CM | POA: Diagnosis not present

## 2020-02-11 DIAGNOSIS — H40013 Open angle with borderline findings, low risk, bilateral: Secondary | ICD-10-CM | POA: Diagnosis not present

## 2020-02-11 DIAGNOSIS — H25812 Combined forms of age-related cataract, left eye: Secondary | ICD-10-CM | POA: Diagnosis not present

## 2020-02-20 ENCOUNTER — Institutional Professional Consult (permissible substitution): Payer: Medicare Other | Admitting: Internal Medicine

## 2020-02-21 ENCOUNTER — Other Ambulatory Visit (HOSPITAL_COMMUNITY)
Admission: RE | Admit: 2020-02-21 | Discharge: 2020-02-21 | Disposition: A | Discharge status: Home / Self Care | Payer: Medicare Other | Source: Ambulatory Visit | Attending: Internal Medicine | Admitting: Internal Medicine

## 2020-02-21 ENCOUNTER — Encounter: Payer: Self-pay | Admitting: Internal Medicine

## 2020-02-21 ENCOUNTER — Ambulatory Visit (INDEPENDENT_AMBULATORY_CARE_PROVIDER_SITE_OTHER): Payer: Medicare Other | Admitting: Internal Medicine

## 2020-02-21 ENCOUNTER — Other Ambulatory Visit: Payer: Self-pay

## 2020-02-21 ENCOUNTER — Institutional Professional Consult (permissible substitution): Payer: Medicare Other | Admitting: Internal Medicine

## 2020-02-21 DIAGNOSIS — R05 Cough: Secondary | ICD-10-CM | POA: Diagnosis not present

## 2020-02-21 DIAGNOSIS — R058 Other specified cough: Secondary | ICD-10-CM | POA: Insufficient documentation

## 2020-02-21 LAB — CBC WITH DIFFERENTIAL/PLATELET
Abs Immature Granulocytes: 0.04 10*3/uL (ref 0.00–0.07)
Basophils Absolute: 0.1 10*3/uL (ref 0.0–0.1)
Basophils Relative: 1 %
Eosinophils Absolute: 0.1 10*3/uL (ref 0.0–0.5)
Eosinophils Relative: 1 %
HCT: 44.2 % (ref 36.0–46.0)
Hemoglobin: 13.8 g/dL (ref 12.0–15.0)
Immature Granulocytes: 0 %
Lymphocytes Relative: 19 %
Lymphs Abs: 2.7 10*3/uL (ref 0.7–4.0)
MCH: 29.5 pg (ref 26.0–34.0)
MCHC: 31.2 g/dL (ref 30.0–36.0)
MCV: 94.4 fL (ref 80.0–100.0)
Monocytes Absolute: 1.2 10*3/uL — ABNORMAL HIGH (ref 0.1–1.0)
Monocytes Relative: 9 %
Neutro Abs: 9.8 10*3/uL — ABNORMAL HIGH (ref 1.7–7.7)
Neutrophils Relative %: 70 %
Platelets: 383 10*3/uL (ref 150–400)
RBC: 4.68 MIL/uL (ref 3.87–5.11)
RDW: 13.2 % (ref 11.5–15.5)
WBC: 13.9 10*3/uL — ABNORMAL HIGH (ref 4.0–10.5)
nRBC: 0 % (ref 0.0–0.2)

## 2020-02-21 MED ORDER — PANTOPRAZOLE SODIUM 40 MG PO TBEC: 40.0000 mg | DELAYED_RELEASE_TABLET | Freq: Every day | ORAL | 2 refills | Status: DC

## 2020-02-21 MED ORDER — FAMOTIDINE 20 MG PO TABS: ORAL_TABLET | ORAL | 11 refills | Status: DC

## 2020-02-21 MED ORDER — BENZONATATE 200 MG PO CAPS: 200.0000 mg | ORAL_CAPSULE | Freq: Three times a day (TID) | ORAL | 1 refills | Status: DC | PRN

## 2020-02-21 MED ORDER — PREDNISONE 10 MG PO TABS: ORAL_TABLET | ORAL | 0 refills | Status: DC

## 2020-02-21 NOTE — Patient Instructions (Addendum)
Stop Breo - ok to go back on it if cough gets worse after a week   Pantoprazole (protonix) 40 mg  Take  30-60 min before first meal of the day and Pepcid (famotidine)  20 mg one after supper  until return to office - this is the best way to tell whether stomach acid is contributing to your problem.    Prednisone 10 mg take  4 each am x 2 days,   2 each am x 2 days,  1 each am x 2 days and stop   For cough > try tessalon 200 mg one every 6 hours as needed   GERD (REFLUX)  is an extremely common cause of respiratory symptoms just like yours , many times with no obvious heartburn at all.    It can be treated with medication, but also with lifestyle changes including elevation of the head of your bed (ideally with 6 -8inch blocks under the headboard of your bed),  Smoking cessation, avoidance of late meals, excessive alcohol, and avoid fatty foods, chocolate, peppermint, colas, red wine, and acidic juices such as orange juice.  NO MINT OR MENTHOL PRODUCTS SO NO COUGH DROPS  USE SUGARLESS CANDY INSTEAD (Jolley ranchers or Stover's or Life Savers) or even ice chips will also do - the key is to swallow to prevent all throat clearing. NO OIL BASED VITAMINS - use powdered substitutes.  Avoid fish oil when coughing.  Please remember to go to the lab department at [facility]    for your tests - we will call you with the results when they are available.   Please schedule a follow up office visit in 4- 6  weeks, sooner if needed             1

## 2020-02-21 NOTE — Assessment & Plan Note (Addendum)
Onset 2012 p mold exposure PFT's   04/05/2018 FEV1 1.49 (64 % ) ratio 0.71  p ? prior to study with DLCO  18.15 (79%) corrects to 4.96 (105%)  for alv volume and FV curve min curvature/ erv 17% - Allergy profile 02/21/2020 >  Eos 0.1 /  IgE   - d/c BREO 02/21/2020 and rx with max gerd rx instead with tessalon prn  Absence of response to asthma rx including BREO 200/ lack fo flare on Beta blocker and absence of cough noct or in early am and lack of assoc rhinitis all point away from allergy/ asthma nad toward Upper airway cough syndrome (previously labeled PNDS),  is so named because it's frequently impossible to sort out how much is  CR/sinusitis with freq throat clearing (which can be related to primary GERD)   vs  causing  secondary (" extra esophageal")  GERD from wide swings in gastric pressure that occur with throat clearing, often  promoting self use of mint and menthol lozenges that reduce the lower esophageal sphincter tone and exacerbate the problem further in a cyclical fashion.   These are the same pts (now being labeled as having "irritable larynx syndrome"-grains of salt in back of throat can trigger as can voice use-  by some cough centers) who not infrequently have a history of having failed to tolerate ace inhibitors,  dry powder inhalers(esp high dose ICS)  or biphosphonates or report having atypical/extraesophageal reflux symptoms (we know she has it because she had to be dilated and this is not definitive rx for reflux but rather dysphagia caused by reflux)  that don't respond to standard doses of PPI  and are easily confused as having aecopd or asthma flares by even experienced allergists/ pulmonologists (myself included).    Of the three most common causes of  Sub-acute / recurrent or chronic cough, only one (GERD)  can actually contribute to/ trigger  the other two (asthma and post nasal drip syndrome)  and perpetuate the cylce of cough.  While not intuitively obvious, many patients  with chronic low grade reflux do not cough until there is a primary insult that disturbs the protective epithelial barrier and exposes sensitive nerve endings.     >>> This is typically viral but can due to PNDS and  either may apply here.   The point is that once this occurs, it is difficult to eliminate the cycle  using anything but a maximally effective acid suppression regimen at least in the short run, accompanied by an appropriate diet to address non acid GERD and control / eliminate the cough itself for at least 3 days with tessalon  And also added 6 days of Prednisone in case of component of Th-2 driven upper or lower airways inflammation (if cough responds short term only to relapse befor return while will on rx for uacs that would point to allergic rhinitis/ asthma or eos bronchitis)    >> stop breo, ok to restart if getting worse   Discussed: The standardized cough guidelines published in Chest by Lissa Morales in 2006 are still the best available and consist of a multiple step process (up to 12!) , not a single office visit,  and are intended  to address this problem logically,  with an alogrithm dependent on response to empiric treatment at  each progressive step  to determine a specific diagnosis with  minimal addtional testing needed. Therefore if adherence is an issue or can't be accurately verified,  it's very  unlikely the standard evaluation and treatment will be successful here.    Furthermore, response to therapy (other than acute cough suppression, which should only be used short term with avoidance of narcotic containing cough syrups if possible), can be a gradual process for which the patient is not likely to  perceive immediate benefit.  Unlike going to an eye doctor where the best perscription is almost always the first one and is immediately effective, this is almost never the case in the management of chronic cough syndromes. Therefore the patient needs to commit up front to  consistently adhere to recommendations  for up to 6 weeks of therapy directed at the likely underlying problem(s) before the response can be reasonably evaluated.    >>> f/u in 6-8 weeks.          Each maintenance medication was reviewed in detail including emphasizing most importantly the difference between maintenance and prns and under what circumstances the prns are to be triggered using an action plan format where appropriate.  Total time for H and P, chart review, counseling, teaching device and generating customized AVS unique to this office visit / charting = 45 min

## 2020-02-21 NOTE — Progress Notes (Addendum)
Kaitlyn Glass, female    DOB: Dec 20, 1950    MRN: [id number]   Brief patient profile:  36 yowf never smoker never allergies with onset of cough p exposure to mold around 2012 and out of that environment since 2018 but not improved >>  eval by [facility] Glass and rx Breo 200 and no better so referred to pulmonary clinic 02/21/2020 by Dr   Kaitlyn Glass    History of Present Illness  02/21/2020  Pulmonary/ 1st office eval/[facility] / [facility]ait[facility] Glass on BREO 200 Chief Complaint  Patient presents with   Consult    Patient is here for cough that she has had for 9 years. Hoarseness over the last couple months that is new. Saw Dr. Luan Glass. Some shortness of breath with exertion. Wheezing in sleep. Productive cough worse in morning with thick white sputum  Dyspnea:   Steps give her trouble  but not flat surfaces / worse when hot / humid walks up to a mile a week prior to OV   Cough: during the day, mostly dry hack / assoc with hoarseness / very sensitive to grainy food even salt grains can make her cough  Nose feels clear  Sleep: bed is flat and 2 pillows / no resp ccs SABA use: none  Sp EGD dilation 2019 not using PPI (insurance doesn't cover Nexium, "it ruins your lungs and liver, right?")  No obvious day to day or daytime variability or assoc excess/ purulent sputum or mucus plugs or hemoptysis or cp or chest tightness, subjective wheeze or overt sinus or hb symptoms.   Sleeping without nocturnal  or early am exacerbation  of respiratory  c/o's or need for noct saba. Also denies any obvious fluctuation of symptoms with weather or environmental changes or other aggravating or alleviating factors except as outlined above   No unusual exposure hx or h/o childhood pna/ asthma or knowledge of premature birth.  Current Allergies, Complete Past Medical History, Past Surgical History, Family History, and Social History were reviewed in [employer] record.  ROS  The following are not active complaints unless  bolded Hoarseness, sore throat, dysphagia, dental problems, itching, sneezing,  nasal congestion or discharge of excess mucus or purulent secretions, ear ache,   fever, chills, sweats, unintended wt loss or wt gain, classically pleuritic or exertional cp,  orthopnea pnd or arm/hand swelling  or leg swelling, presyncope, palpitations, abdominal pain, anorexia, nausea, vomiting, diarrhea  or change in bowel habits or change in bladder habits, change in stools or change in urine, dysuria, hematuria,  rash, arthralgias, visual complaints, headache, numbness, weakness or ataxia or problems with walking or coordination,  change in mood or  memory.           Past Medical History:  Diagnosis Date   Aneurysm ([place]) 1997   no surgery   Anxiety    Benign familial tremor    GERD (gastroesophageal reflux disease)    Headache(784.0)    Hypercholesterolemia     Outpatient Medications Prior to Visit  Medication Sig Dispense Refill   acetaminophen (TYLENOL) 500 MG tablet Take 500 mg by mouth every 6 (six) hours as needed for moderate pain or headache.      BREO ELLIPTA 200-25 MCG/INH AEPB Inhale 1 puff into the lungs daily.  0   Calcium Carb-Cholecalciferol (CALCIUM 600/VITAMIN D3 PO) Take 1 tablet by mouth daily.     Cholecalciferol (VITAMIN D3) 2000 units TABS Take 2,000 Units by mouth daily.      esomeprazole (  NEXIUM) 40 MG capsule Take 40 mg by mouth daily as needed (for acid reflux).      propranolol (INDERAL) 10 MG tablet Take 10 mg by mouth daily. May take up to 30mg  daily as needed     simvastatin (ZOCOR) 10 MG tablet Take 10 mg by mouth every evening.      No facility-administered medications prior to visit.     Objective:     BP (!) 140/80 (BP Location: Left Arm, Patient Position: Sitting, Cuff Size: Normal)    Pulse 70    Temp 99 F (37.2 C) (Oral)    Ht 5\' 4"  (1.626 m)    Wt 170 lb (77.1 kg)    SpO2 99%    BMI 29.18 kg/m   SpO2: 99 %   Very hoarse wf harsh cough at end  exp    HEENT : pt wearing mask not removed for exam due to covid -19 concerns.    NECK :  without JVD/Nodes/TM/ nl carotid upstrokes bilaterally   LUNGS: no acc muscle use,  Nl contour chest which is clear to A and P bilaterally without cough on insp or exp maneuvers   CV:  RRR  no s3 or murmur or increase in P2, and no edema   ABD:  soft and nontender with nl inspiratory excursion in the supine position. No bruits or organomegaly appreciated, bowel sounds nl  MS:  Nl gait/ ext warm without deformities, calf tenderness, cyanosis or clubbing No obvious joint restrictions   SKIN: warm and dry without lesions    NEURO:  alert, approp, nl sensorium with  no motor or cerebellar deficits apparent.     Labs ordered 02/21/2020  :  allergy profile        I personally reviewed images and agree with radiology impression as follows:   Chest CT w/o contrast 01/29/20  1. Stable, benign small pulmonary nodules. No further routine CT follow-up is required for these nodules. 2. Occasional bronchial mucous plugging, consistent with nonspecific infectious or inflammatory small airways disease.    Assessment   Upper airway cough syndrome Onset 2012 p mold exposure PFT's   04/05/2018 FEV1 1.49 (64 % ) ratio 0.71  p ? prior to study with DLCO  18.15 (79%) corrects to 4.96 (105%)  for alv volume and FV curve min curvature/ erv 17% - Allergy profile 02/21/2020 >  Eos 0.1 /  IgE   - d/c BREO 02/21/2020 and rx with max gerd rx instead with tessalon prn  Absence of response to asthma rx including BREO 200/ lack fo flare on Beta blocker and absence of cough noct or in early am and lack of assoc rhinitis all point away from allergy/ asthma nad toward Upper airway cough syndrome (previously labeled PNDS),  is so named because it's frequently impossible to sort out how much is  CR/sinusitis with freq throat clearing (which can be related to primary GERD)   vs  causing  secondary (" extra esophageal")  GERD  from wide swings in gastric pressure that occur with throat clearing, often  promoting self use of mint and menthol lozenges that reduce the lower esophageal sphincter tone and exacerbate the problem further in a cyclical fashion.   These are the same pts (now being labeled as having "irritable larynx syndrome"-grains of salt in back of throat can trigger as can voice use-  by some cough centers) who not infrequently have a history of having failed to tolerate ace inhibitors,  dry powder  inhalers(esp high dose ICS)  or biphosphonates or report having atypical/extraesophageal reflux symptoms (we know she has it because she had to be dilated and this is not definitive rx for reflux but rather dysphagia caused by reflux)  that don't respond to standard doses of PPI  and are easily confused as having aecopd or asthma flares by even experienced allergists/ pulmonologists (myself included).    Of the three most common causes of  Sub-acute / recurrent or chronic cough, only one (GERD)  can actually contribute to/ trigger  the other two (asthma and post nasal drip syndrome)  and perpetuate the cylce of cough.  While not intuitively obvious, many patients with chronic low grade reflux do not cough until there is a primary insult that disturbs the protective epithelial barrier and exposes sensitive nerve endings.     >>> This is typically viral but can due to PNDS and  either may apply here.   The point is that once this occurs, it is difficult to eliminate the cycle  using anything but a maximally effective acid suppression regimen at least in the short run, accompanied by an appropriate diet to address non acid GERD and control / eliminate the cough itself for at least 3 days with tessalon  And also added 6 days of Prednisone in case of component of Th-2 driven upper or lower airways inflammation (if cough responds short term only to relapse befor return while will on rx for uacs that would point to allergic  rhinitis/ asthma or eos bronchitis)    >> stop breo, ok to restart if getting worse   Discussed: The standardized cough guidelines published in Chest by Lissa Morales in 2006 are still the best available and consist of a multiple step process (up to 12!) , not a single office visit,  and are intended  to address this problem logically,  with an alogrithm dependent on response to empiric treatment at  each progressive step  to determine a specific diagnosis with  minimal addtional testing needed. Therefore if adherence is an issue or can't be accurately verified,  it's very unlikely the standard evaluation and treatment will be successful here.    Furthermore, response to therapy (other than acute cough suppression, which should only be used short term with avoidance of narcotic containing cough syrups if possible), can be a gradual process for which the patient is not likely to  perceive immediate benefit.  Unlike going to an eye doctor where the best perscription is almost always the first one and is immediately effective, this is almost never the case in the management of chronic cough syndromes. Therefore the patient needs to commit up front to consistently adhere to recommendations  for up to 6 weeks of therapy directed at the likely underlying problem(s) before the response can be reasonably evaluated.    >>> f/u in 6-8 weeks.          Each maintenance medication was reviewed in detail including emphasizing most importantly the difference between maintenance and prns and under what circumstances the prns are to be triggered using an action plan format where appropriate.  Total time for H and P, chart review, counseling, teaching device and generating customized AVS unique to this office visit / charting = 45 min           Christinia Gully, MD 02/21/2020

## 2020-02-28 ENCOUNTER — Other Ambulatory Visit (HOSPITAL_COMMUNITY): Payer: Self-pay | Admitting: Family Medicine

## 2020-02-28 DIAGNOSIS — Z1231 Encounter for screening mammogram for malignant neoplasm of breast: Secondary | ICD-10-CM

## 2020-03-02 LAB — MISC LABCORP TEST (SEND OUT): Labcorp test code: 607111

## 2020-03-25 ENCOUNTER — Ambulatory Visit (INDEPENDENT_AMBULATORY_CARE_PROVIDER_SITE_OTHER): Payer: Medicare Other | Admitting: Internal Medicine

## 2020-03-25 ENCOUNTER — Ambulatory Visit (HOSPITAL_COMMUNITY)
Admission: RE | Admit: 2020-03-25 | Discharge: 2020-03-25 | Disposition: A | Discharge status: Home / Self Care | Payer: Medicare Other | Source: Ambulatory Visit | Attending: Internal Medicine | Admitting: Internal Medicine

## 2020-03-25 ENCOUNTER — Telehealth: Payer: Self-pay | Admitting: Internal Medicine

## 2020-03-25 ENCOUNTER — Other Ambulatory Visit: Payer: Self-pay

## 2020-03-25 ENCOUNTER — Encounter: Payer: Self-pay | Admitting: Internal Medicine

## 2020-03-25 DIAGNOSIS — R05 Cough: Secondary | ICD-10-CM | POA: Diagnosis not present

## 2020-03-25 DIAGNOSIS — R058 Other specified cough: Secondary | ICD-10-CM

## 2020-03-25 MED ORDER — PREDNISONE 10 MG PO TABS: ORAL_TABLET | ORAL | 0 refills | Status: DC

## 2020-03-25 MED ORDER — GABAPENTIN 100 MG PO CAPS
100.0000 mg | ORAL_CAPSULE | Freq: Four times a day (QID) | ORAL | 2 refills | Status: DC
Start: 2020-03-25 — End: 2020-03-26

## 2020-03-25 NOTE — Assessment & Plan Note (Addendum)
Onset 2012 p mold exposure PFT's   04/05/2018 FEV1 1.49 (64 % ) ratio 0.71  p ? prior to study with DLCO  18.15 (79%) corrects to 4.96 (105%)  for alv volume and FV curve min curvature/ erv 17%   - Allergy profile 02/21/2020 >  Eos 0.1 /  IgE  13 neg RAST  - d/c BREO 02/21/2020 and rx with max gerd rx instead with tessalon prn> cough improved but still throat clearing - 03/25/2020 rec gabapentin 100 mg qid   The absence of noct cough or resp to breo favors strongly uacs but the cough on exp is more typical of AB  Of the three most common causes of  Sub-acute / recurrent or chronic cough, only one (GERD)  can actually contribute to/ trigger  the other two (asthma and post nasal drip syndrome)  and perpetuate the cylce of cough.  While not intuitively obvious, many patients with chronic low grade reflux do not cough until there is a primary insult that disturbs the protective epithelial barrier and exposes sensitive nerve endings.   This is typically viral but can due to PNDS and  either may apply here.   The point is that once this occurs, it is difficult to eliminate the cycle  using anything but a maximally effective acid suppression regimen at least in the short run, accompanied by an appropriate diet to address non acid GERD and control / eliminate the cough itself with gabapentin titrated for now up to 100 mg qid if tolerates   >>> f/u in 6 weeks         Each maintenance medication was reviewed in detail including emphasizing most importantly the difference between maintenance and prns and under what circumstances the prns are to be triggered using an action plan format where appropriate.  Total time for H and P, chart review, counseling,   and generating customized AVS unique to this office visit / charting = 37 min

## 2020-03-25 NOTE — Patient Instructions (Addendum)
Gabapentin 100 mg build up to 4  times  Start with bedtime dose  Prednisone 10 mg take  4 each am x 2 days,   2 each am x 2 days,  1 each am x 2 days and stop    Acupuncturist fruit flavored   Please remember to go to the  x-ray department  @  [facility] for your tests - we will call you with the results when they are available      Please schedule a follow up office visit in 6 weeks, call sooner if needed

## 2020-03-25 NOTE — Telephone Encounter (Signed)
Called and spoke with [facility] they stated they needed a DEA number and clarification on instructions for Gabapentin. Provided both for pharmacy. Called and left message for patient letting her know that I spoke with pharmacy and everything is good to go. Nothing further needed at this time.

## 2020-03-25 NOTE — Progress Notes (Signed)
Kaitlyn Glass, female    DOB: 25-Jun-1951    MRN: [id number]   Brief patient profile:  54 yowf never smoker never allergies with onset of cough p exposure to mold around 2012 and out of that environment since 2018 but not improved >>  eval by Kaitlyn Glass and rx Breo 200 and no better so referred to pulmonary clinic 02/21/2020 by Dr   Kaitlyn Glass    History of Present Illness  02/21/2020  Pulmonary/ 1st office eval/Kaitlyn Glass / Kaitlyn Glass on BREO 200 Chief Complaint  Patient presents with   Consult    Patient is here for cough that she has had for 9 years. Hoarseness over the last couple months that is new. Saw Dr. Luan Glass. Some shortness of breath with exertion. Wheezing in sleep. Productive cough worse in morning with thick white sputum  Dyspnea:   Steps give her trouble  but not flat surfaces / worse when hot / humid walks up to a mile a week prior to OV   Cough: during the day, mostly dry hack / assoc with hoarseness / very sensitive to grainy food even salt grains can make her cough  Nose feels clear  Sleep: bed is flat and 2 pillows / no resp ccs SABA use: none  Sp EGD dilation 2019 not using PPI (insurance doesn't cover Nexium, "it ruins your lungs and liver, right?") rec Stop Breo - ok to go back on it if cough gets worse after a week  Pantoprazole (protonix) 40 mg  Take  30-60 min before first meal of the day and Pepcid (famotidine)  20 mg one after supper  until return to office   Prednisone 10 mg take  4 each am x 2 days,   2 each am x 2 days,  1 each am x 2 days and stop  For cough > try tessalon 200 mg one every 6 hours as needed  GERD  Please remember to go to the lab department at [facility]    for your tests - we will call you with the results when they are available.     03/25/2020  f/u ov/[facility]aitlyn Glass office/Kaitlyn Glass re: cough daily x 2012 ? Some better p retired / better p prednisone but still urge to clear throat  Chief Complaint  Patient presents with   Follow-up    Cough is slightly improved.  Hoarsness unchanged. She stopped tessalon since made her too drowsy.   Dyspnea:  Not limited by breathing from desired activities   Cough: worse bending over / dry  Sleeping: bed is flat/ one pillow no problem noct cough  SABA use: none  02: none  Taking inderol 10-20 mg daily    No obvious day to day or daytime variability or assoc excess/ purulent sputum or mucus plugs or hemoptysis or cp or chest tightness, subjective wheeze or overt sinus or hb symptoms.   Sleeping  without nocturnal  or early am exacerbation  of respiratory  c/o's or need for noct saba. Also denies any obvious fluctuation of symptoms with weather or environmental changes or other aggravating or alleviating factors except as outlined above   No unusual exposure hx or h/o childhood pna/ asthma or knowledge of premature birth.  Current Allergies, Complete Past Medical History, Past Surgical History, Family History, and Social History were reviewed in [employer] record.  ROS  The following are not active complaints unless bolded Hoarseness, sore throat, dysphagia, dental problems, itching, sneezing,  nasal congestion or discharge of excess  mucus or purulent secretions, ear ache,   fever, chills, sweats, unintended wt loss or wt gain, classically pleuritic or exertional cp,  orthopnea pnd or arm/hand swelling  or leg swelling, presyncope, palpitations, abdominal pain, anorexia, nausea, vomiting, diarrhea  or change in bowel habits or change in bladder habits, change in stools or change in urine, dysuria, hematuria,  rash, arthralgias, visual complaints, headache, numbness, weakness or ataxia or problems with walking or coordination,  change in mood or  memory.        Current Meds  Medication Sig   acetaminophen (TYLENOL) 500 MG tablet Take 500 mg by mouth every 6 (six) hours as needed for moderate pain or headache.    Calcium Carb-Cholecalciferol (CALCIUM 600/VITAMIN D3 PO) Take 1 tablet by mouth  daily.   Cholecalciferol (VITAMIN D3) 2000 units TABS Take 2,000 Units by mouth daily.    famotidine (PEPCID) 20 MG tablet One after supper   pantoprazole (PROTONIX) 40 MG tablet Take 1 tablet (40 mg total) by mouth daily. Take 30-60 min before first meal of the day   propranolol (INDERAL) 10 MG tablet Take 10 mg by mouth daily. May take up to 30mg  daily as needed   simvastatin (ZOCOR) 10 MG tablet Take 10 mg by mouth every evening.             Past Medical History:  Diagnosis Date   Aneurysm ([place]) 1997   no surgery   Anxiety    Benign familial tremor    GERD (gastroesophageal reflux disease)    Headache(784.0)    Hypercholesterolemia       Objective:     Wt Readings from Last 3 Encounters:  03/25/20 172 lb (78 kg)  02/21/20 170 lb (77.1 kg)  06/20/18 176 lb (79.8 kg)     Vital signs reviewed - Note on arrival 03/25/2020  02 sats  97% on RA       very hoarse amb wf   HEENT : pt wearing mask not removed for exam due to covid -19 concerns.    NECK :  without JVD/Nodes/TM/ nl carotid upstrokes bilaterally   LUNGS: no acc muscle use,  Nl contour chest which is clear to A and P bilaterally with  cough on  exp maneuvers   CV:  RRR  no s3 or murmur or increase in P2, and no edema   ABD:  soft and nontender with nl inspiratory excursion in the supine position. No bruits or organomegaly appreciated, bowel sounds nl  MS:  Nl gait/ ext warm without deformities, calf tenderness, cyanosis or clubbing No obvious joint restrictions   SKIN: warm and dry without lesions    NEURO:  alert, approp, nl sensorium with  no motor or cerebellar deficits apparent.           CXR PA and Lateral:   03/25/2020 :    I personally reviewed images and agree with radiology impression as follows:   No active cardiopulmonary disease.     Assessment

## 2020-03-26 ENCOUNTER — Telehealth: Payer: Self-pay | Admitting: Internal Medicine

## 2020-03-26 MED ORDER — GABAPENTIN 100 MG PO CAPS: 100.0000 mg | ORAL_CAPSULE | Freq: Three times a day (TID) | ORAL | 2 refills | Status: DC

## 2020-03-26 NOTE — Telephone Encounter (Signed)
Instructions from OV 03/25/20  Gabapentin 100 mg build up to 4  times  Start with bedtime dose  Prednisone 10 mg take  4 each am x 2 days,   2 each am x 2 days,  1 each am x 2 days and stop    Acupuncturist fruit flavored   Please remember to go to the  x-ray department  @  [facility] for your tests - we will call you with the results when they are available      Please schedule a follow up office visit in 6 weeks, call sooner if needed       Dr. Melvyn Novas, please advise on pt's gabapentin Rx that was sent to pharmacy. Pharmacy is calling stating that the max dose is TID.

## 2020-03-26 NOTE — Telephone Encounter (Signed)
Called pt's pharmacy and spoke with Aldona Bar stating to her that MW said we could change pt's gabapentin to tid and she verbalized understanding. Tried to resend the Rx electronically but the Rx kept printing.  Spoke with pharmacist Deja and provided a verbal Rx of the gabapentin. Nothing further needed.

## 2020-03-26 NOTE — Progress Notes (Signed)
Called and spoke patient about xray results per Dr Melvyn Novas. All questions answered and patient expressed full understanding.

## 2020-03-26 NOTE — Telephone Encounter (Signed)
I told her to build up with goal of getting up to 900 mg per day at whatever lowest total dose works - it [employer] account planner if take 100 mg qid or 200 mg bid it's the same total amount but more gradual titration up - if pharmacy insists on tid that's fine just give 90 for now and I'll change the total next tme

## 2020-03-26 NOTE — Progress Notes (Signed)
Called and spoke with patient about xray results per Dr Melvyn Novas. All questions answered and patient expressed full understanding. Nothing further needed at this time.

## 2020-03-30 ENCOUNTER — Other Ambulatory Visit: Payer: Self-pay

## 2020-03-30 ENCOUNTER — Ambulatory Visit (HOSPITAL_COMMUNITY)
Admission: RE | Admit: 2020-03-30 | Discharge: 2020-03-30 | Disposition: A | Discharge status: Home / Self Care | Payer: Medicare Other | Source: Ambulatory Visit | Attending: Family Medicine | Admitting: Family Medicine

## 2020-03-30 DIAGNOSIS — Z1231 Encounter for screening mammogram for malignant neoplasm of breast: Secondary | ICD-10-CM | POA: Diagnosis not present

## 2020-05-18 DIAGNOSIS — Z6830 Body mass index (BMI) 30.0-30.9, adult: Secondary | ICD-10-CM | POA: Diagnosis not present

## 2020-05-18 DIAGNOSIS — Z23 Encounter for immunization: Secondary | ICD-10-CM | POA: Diagnosis not present

## 2020-05-18 DIAGNOSIS — G250 Essential tremor: Secondary | ICD-10-CM | POA: Diagnosis not present

## 2020-05-18 DIAGNOSIS — Z1389 Encounter for screening for other disorder: Secondary | ICD-10-CM | POA: Diagnosis not present

## 2020-05-18 DIAGNOSIS — R259 Unspecified abnormal involuntary movements: Secondary | ICD-10-CM | POA: Diagnosis not present

## 2020-05-18 DIAGNOSIS — R7309 Other abnormal glucose: Secondary | ICD-10-CM | POA: Diagnosis not present

## 2020-05-18 DIAGNOSIS — E6609 Other obesity due to excess calories: Secondary | ICD-10-CM | POA: Diagnosis not present

## 2020-05-18 DIAGNOSIS — J309 Allergic rhinitis, unspecified: Secondary | ICD-10-CM | POA: Diagnosis not present

## 2020-05-18 DIAGNOSIS — Z1331 Encounter for screening for depression: Secondary | ICD-10-CM | POA: Diagnosis not present

## 2020-05-18 DIAGNOSIS — Z0001 Encounter for general adult medical examination with abnormal findings: Secondary | ICD-10-CM | POA: Diagnosis not present

## 2020-05-18 DIAGNOSIS — E559 Vitamin D deficiency, unspecified: Secondary | ICD-10-CM | POA: Diagnosis not present

## 2020-05-18 DIAGNOSIS — E782 Mixed hyperlipidemia: Secondary | ICD-10-CM | POA: Diagnosis not present

## 2020-05-18 DIAGNOSIS — E7849 Other hyperlipidemia: Secondary | ICD-10-CM | POA: Diagnosis not present

## 2020-05-18 DIAGNOSIS — F419 Anxiety disorder, unspecified: Secondary | ICD-10-CM | POA: Diagnosis not present

## 2020-05-18 DIAGNOSIS — K219 Gastro-esophageal reflux disease without esophagitis: Secondary | ICD-10-CM | POA: Diagnosis not present

## 2020-05-19 ENCOUNTER — Ambulatory Visit (INDEPENDENT_AMBULATORY_CARE_PROVIDER_SITE_OTHER): Payer: Medicare Other | Admitting: Internal Medicine

## 2020-05-19 ENCOUNTER — Other Ambulatory Visit: Payer: Self-pay

## 2020-05-19 ENCOUNTER — Encounter: Payer: Self-pay | Admitting: Internal Medicine

## 2020-05-19 DIAGNOSIS — R058 Other specified cough: Secondary | ICD-10-CM | POA: Diagnosis not present

## 2020-05-19 NOTE — Assessment & Plan Note (Signed)
Onset 2012 p mold exposure PFT's   04/05/2018 FEV1 1.49 (64 % ) ratio 0.71  p ? prior to study with DLCO  18.15 (79%) corrects to 4.96 (105%)  for alv volume and FV curve min curvature/ erv 17%   - Allergy profile 02/21/2020 >  Eos 0.1 /  IgE  13 neg RAST  - d/c BREO 02/21/2020 and rx with max gerd rx instead with tessalon prn> cough improved but still throat clearing - 03/25/2020 rec gabapentin 100 mg qid >never started - 05/19/2020 rechallenge with gabapentin up to 100 mg tid or whatever dose effective as has concerns re reported side effects with next step > refer to ENT / Joya Gaskins at [facility] voice center  Of the three most common causes of  Sub-acute / recurrent or chronic cough, only one (GERD)  can actually contribute to/ trigger  the other two (asthma and post nasal drip syndrome)  and perpetuate the cylce of cough.  While not intuitively obvious, many patients with chronic low grade reflux do not cough until there is a primary insult that disturbs the protective epithelial barrier and exposes sensitive nerve endings.   This is typically viral but can due to PNDS and  either may apply here.   The point is that once this occurs, it is difficult to eliminate the cycle  using anything but a maximally effective acid suppression regimen at least in the short run, accompanied by an appropriate diet to address non acid GERD and control / eliminate the cough itself with gabapentin hopefully at lowest doses as has concerns about reported side effects  Discussed in detail all the  indications, usual  risks and alternatives  relative to the benefits with patient who agrees to proceed with Rx as outlined.             Each maintenance medication was reviewed in detail including emphasizing most importantly the difference between maintenance and prns and under what circumstances the prns are to be triggered using an action plan format where appropriate.  Total time for H and P, chart review, counseling,   and  generating customized AVS unique to this office visit / charting   > 30 min

## 2020-05-19 NOTE — Progress Notes (Signed)
Kaitlyn Glass, female    DOB: 07/16/1951    MRN: [id number]   Brief patient profile:  34 yowf never smoker never allergies with onset of cough p exposure to mold around 2012 and out of that environment since 2018 but not improved >>  eval by Kaitlyn Glass and rx Breo 200 and no better so referred to pulmonary clinic 02/21/2020 by Dr   Kaitlyn Glass    History of Present Illness  02/21/2020  Pulmonary/ 1st office eval/Kaitlyn Glass / Kaitlyn Glass on BREO 200 Chief Complaint  Patient presents with  . Consult    Patient is here for cough that she has had for 9 years. Hoarseness over the last couple months that is new. Saw Dr. Luan Glass. Some shortness of breath with exertion. Wheezing in sleep. Productive cough worse in morning with thick white sputum  Dyspnea:   Steps give her trouble  but not flat surfaces / worse when hot / humid walks up to a mile a week prior to OV   Cough: during the day, mostly dry hack / assoc with hoarseness / very sensitive to grainy food even salt grains can make her cough  Nose feels clear  Sleep: bed is flat and 2 pillows / no resp ccs SABA use: none  Sp EGD dilation 2019 not using PPI (insurance doesn't cover Nexium, "it ruins your lungs and liver, right?") rec Stop Breo - ok to go back on it if cough gets worse after a week  Pantoprazole (protonix) 40 mg  Take  30-60 min before first meal of the day and Pepcid (famotidine)  20 mg one after supper  until return to office   Prednisone 10 mg take  4 each am x 2 days,   2 each am x 2 days,  1 each am x 2 days and stop  For cough > try tessalon 200 mg one every 6 hours as needed  GERD  Please remember to go to the lab department at [facility]    for your tests - we will call you with the results when they are available.     03/25/2020  f/u ov/[place] office/Kaitlyn Glass re: cough daily x 2012 ? Some better p retired / better p prednisone but still urge to clear throat  Chief Complaint  Patient presents with  . Follow-up    Cough is slightly improved.  Hoarsness unchanged. She stopped tessalon since made her too drowsy.   Dyspnea:  Not limited by breathing from desired activities   Cough: worse bending over / dry  Sleeping: bed is flat/ one pillow no problem noct cough  SABA use: none  02: none  Taking inderol 10-20 mg daily  rec Gabapentin 100 mg build up to 4  times  Start with bedtime dose Prednisone 10 mg take  4 each am x 2 days,   2 each am x 2 days,  1 each am x 2 days and stop  Acupuncturist fruit flavored  rec Gabapentin 100 mg build up to 4  times  Start with bedtime dose Prednisone 10 mg take  4 each am x 2 days,   2 each am x 2 days,  1 each am x 2 days and stop  [employer] or [employer] fruit flavored     05/19/2020  f/u ov/Kaitlyn Glass re: cough x 2012 did not use gabapentin yet  Chief Complaint  Patient presents with  . Follow-up    Cough is overall doing better. When she does cough it  is non prod.   Dyspnea:  Much better at [facility] x when exposed to wood burning fires / only sob when coughing  Cough: dry / worse when ben over  Day >> noct  Sleeping: bed is flat two pillows  SABA use: none  02: none    No obvious day to day or daytime variability or assoc excess/ purulent sputum or mucus plugs or hemoptysis or cp or chest tightness, subjective wheeze or overt sinus or hb symptoms.   Sleeping  without nocturnal  or early am exacerbation  of respiratory  c/o's or need for noct saba. Also denies any obvious fluctuation of symptoms with weather or environmental changes or other aggravating or alleviating factors except as outlined above   No unusual exposure hx or h/o childhood pna/ asthma or knowledge of premature birth.  Current Allergies, Complete Past Medical History, Past Surgical History, Family History, and Social History were reviewed in [employer] record.  ROS  The following are not active complaints unless bolded Hoarseness, sore throat= globus sensation , dysphagia, dental  problems, itching, sneezing,  nasal congestion or discharge of excess mucus or purulent secretions, ear ache,   fever, chills, sweats, unintended wt loss or wt gain, classically pleuritic or exertional cp,  orthopnea pnd or arm/hand swelling  or leg swelling, presyncope, palpitations, abdominal pain, anorexia, nausea, vomiting, diarrhea  or change in bowel habits or change in bladder habits, change in stools or change in urine, dysuria, hematuria,  rash, arthralgias, visual complaints, headache, numbness, weakness or ataxia or problems with walking or coordination,  change in mood or  memory.        Current Meds  Medication Sig  . acetaminophen (TYLENOL) 500 MG tablet Take 500 mg by mouth every 6 (six) hours as needed for moderate pain or headache.   . Calcium Carb-Cholecalciferol (CALCIUM 600/VITAMIN D3 PO) Take 1 tablet by mouth daily.  . Cholecalciferol (VITAMIN D3) 2000 units TABS Take 2,000 Units by mouth daily.   . famotidine (PEPCID) 20 MG tablet One after supper  . gabapentin (NEURONTIN) 100 MG capsule Take 1 capsule (100 mg total) by mouth 3 (three) times daily.  . pantoprazole (PROTONIX) 40 MG tablet Take 1 tablet (40 mg total) by mouth daily. Take 30-60 min before first meal of the day  . propranolol (INDERAL) 10 MG tablet Take 10 mg by mouth daily. May take up to 30mg  daily as needed  . simvastatin (ZOCOR) 10 MG tablet Take 10 mg by mouth every evening.                   Past Medical History:  Diagnosis Date  . Aneurysm ([place]) 1997   no surgery  . Anxiety   . Benign familial tremor   . GERD (gastroesophageal reflux disease)   . Headache(784.0)   . Hypercholesterolemia       Objective:     05/19/2020      171   03/25/20 172 lb (78 kg)  02/21/20 170 lb (77.1 kg)  06/20/18 176 lb (79.8 kg)    Vital signs reviewed  05/19/2020  - Note at rest 02 sats  97% on RA    Amb slt hoarse pleasant wf with freq throat clearing    HEENT : pt wearing mask not removed for exam  due to covid -19 concerns.    NECK :  without JVD/Nodes/TM/ nl carotid upstrokes bilaterally   LUNGS: no acc muscle use,  Nl contour chest which is  clear to A and P bilaterally without cough on insp or exp maneuvers   CV:  RRR  no s3 or murmur or increase in P2, and no edema   ABD:  soft and nontender with nl inspiratory excursion in the supine position. No bruits or organomegaly appreciated, bowel sounds nl  MS:  Nl gait/ ext warm without deformities, calf tenderness, cyanosis or clubbing No obvious joint restrictions   SKIN: warm and dry without lesions    NEURO:  alert, approp, nl sensorium with  no motor or cerebellar deficits apparent.                 Assessment

## 2020-05-19 NOTE — Patient Instructions (Signed)
Start gabapentin 100 mg at bedtime for a week then twice daily for a week then    one three times a day - continue the lowest dose that works   Once the cough is gone wean off the acid suppression starting with pantoprazole and replace with pecid after breakfast for a week then drop the supper dose for a week and then the am dose   Keep the candy handy  Please schedule a follow up visit in 3 months but call sooner if needed - call for referral to Annie Main at [place] if not making progress

## 2021-01-07 DIAGNOSIS — Z6830 Body mass index (BMI) 30.0-30.9, adult: Secondary | ICD-10-CM | POA: Diagnosis not present

## 2021-01-07 DIAGNOSIS — Z1331 Encounter for screening for depression: Secondary | ICD-10-CM | POA: Diagnosis not present

## 2021-01-07 DIAGNOSIS — E6609 Other obesity due to excess calories: Secondary | ICD-10-CM | POA: Diagnosis not present

## 2021-01-07 DIAGNOSIS — G5702 Lesion of sciatic nerve, left lower limb: Secondary | ICD-10-CM | POA: Diagnosis not present

## 2021-01-07 DIAGNOSIS — F419 Anxiety disorder, unspecified: Secondary | ICD-10-CM | POA: Diagnosis not present

## 2021-02-15 ENCOUNTER — Encounter: Payer: Self-pay | Admitting: Internal Medicine

## 2021-02-15 ENCOUNTER — Other Ambulatory Visit: Payer: Self-pay

## 2021-02-15 ENCOUNTER — Ambulatory Visit (INDEPENDENT_AMBULATORY_CARE_PROVIDER_SITE_OTHER): Payer: Medicare Other | Admitting: Internal Medicine

## 2021-02-15 DIAGNOSIS — H35372 Puckering of macula, left eye: Secondary | ICD-10-CM | POA: Diagnosis not present

## 2021-02-15 DIAGNOSIS — Z961 Presence of intraocular lens: Secondary | ICD-10-CM | POA: Diagnosis not present

## 2021-02-15 DIAGNOSIS — R058 Other specified cough: Secondary | ICD-10-CM

## 2021-02-15 DIAGNOSIS — H43812 Vitreous degeneration, left eye: Secondary | ICD-10-CM | POA: Diagnosis not present

## 2021-02-15 DIAGNOSIS — H25812 Combined forms of age-related cataract, left eye: Secondary | ICD-10-CM | POA: Diagnosis not present

## 2021-02-15 DIAGNOSIS — H40013 Open angle with borderline findings, low risk, bilateral: Secondary | ICD-10-CM | POA: Diagnosis not present

## 2021-02-15 MED ORDER — GABAPENTIN 100 MG PO CAPS
100.0000 mg | ORAL_CAPSULE | Freq: Three times a day (TID) | ORAL | 2 refills | Status: DC
Start: 2021-02-15 — End: 2021-06-18

## 2021-02-15 NOTE — Progress Notes (Signed)
Kaitlyn Glass, female    DOB: 05-Jul-1951    MRN: [id number]   Brief patient profile:  59 yowf never smoker never allergies with onset of cough p exposure to mold around 2012 and out of that environment since 2018 but not improved >>  eval by Kaitlyn Glass and rx Breo 200 and no better so referred to pulmonary clinic 02/21/2020 by Dr   Kaitlyn Glass    History of Present Illness  02/21/2020  Pulmonary/ 1st Glass eval/Kaitlyn Glass / Kaitlyn Glass on BREO 200 Chief Complaint  Patient presents with   Consult    Patient is here for cough that she has had for 9 years. Hoarseness over the last couple months that is new. Saw Dr. Luan Glass. Some shortness of breath with exertion. Wheezing in sleep. Productive cough worse in morning with thick white sputum  Dyspnea:   Steps give her trouble  but not flat surfaces / worse when hot / humid walks up to a mile a week prior to OV   Cough: during the day, mostly dry hack / assoc with hoarseness / very sensitive to grainy food even salt grains can make her cough  Nose feels clear  Sleep: bed is flat and 2 pillows / no resp ccs SABA use: none  Sp EGD dilation 2019 not using PPI (insurance doesn't cover Nexium, "it ruins your lungs and liver, right?") rec Stop Breo - ok to go back on it if cough gets worse after a week  Pantoprazole (protonix) 40 mg  Take  30-60 min before first meal of the day and Pepcid (famotidine)  20 mg one after supper  until return to [place]   Prednisone 10 mg take  4 each am x 2 days,   2 each am x 2 days,  1 each am x 2 days and stop  For cough > try tessalon 200 mg one every 6 hours as needed  GERD  Please remember to go to the lab department at [facility]    for your tests - we will call you with the results when they are available.     03/25/2020  f/u ov/Kaitlyn Glass re: cough daily x 2012 ? Some better p retired / better p prednisone but still urge to clear throat  Chief Complaint  Patient presents with   Follow-up    Cough is slightly improved.  Hoarsness unchanged. She stopped tessalon since made her too drowsy.   Dyspnea:  Not limited by breathing from desired activities   Cough: worse bending over / dry  Sleeping: bed is flat/ one pillow no problem noct cough  SABA use: none  02: none  Taking inderol 10-20 mg daily  rec Gabapentin 100 mg build up to 4  times  Start with bedtime dose Prednisone 10 mg take  4 each am x 2 days,   2 each am x 2 days,  1 each am x 2 days and stop  Acupuncturist fruit flavored  rec Gabapentin 100 mg build up to 4  times  Start with bedtime dose Prednisone 10 mg take  4 each am x 2 days,   2 each am x 2 days,  1 each am x 2 days and stop  [employer] or [employer] fruit flavored     05/19/2020  f/u ov/Kaitlyn Glass re: cough x 2012 did not use gabapentin yet  Chief Complaint  Patient presents with   Follow-up    Cough is overall doing better. When she does cough it  is non prod.   Dyspnea:  Much better at [facility] x when exposed to wood burning fires / only sob when coughing  Cough: dry / worse when ben over  Day >> noct  Sleeping: bed is flat two pillows  SABA use: none  02: none  Rec Start gabapentin 100 mg at bedtime for a week then twice daily for a week then    one three times a day - continue the lowest dose that works  Once the cough is gone wean off the acid suppression starting with pantoprazole and replace with pepcid after breakfast for a week then drop the supper dose for a week and then the am dose  Keep the candy handy Please schedule a follow up visit in 3 months but call sooner if needed - call for referral to Kaitlyn Glass at [facility] if not making progress     02/15/2021  f/u ov/Kaitlyn Glass Glass/Kaitlyn Glass re: cough x 2012 maint on pepcid prn  Chief Complaint  Patient presents with   Follow-up    Cough has improved overall since the last visit. She states cough comes and goes.    Dyspnea:  Not limited by breathing from desired activities   Cough: worse bending over, worse day  than night/ worse with voice use  Sleeping: ok flat bed two pillow occ am cough but it doesn't disturb  SABA use: none 02: no  Covid status: vax x 2  Lung cancer screening: never smoker  Gabapentin 100 mg one daily did not work and so stopped it    No obvious day to day or daytime variability or assoc excess/ purulent sputum or mucus plugs or hemoptysis or cp or chest tightness, subjective wheeze or overt sinus or hb symptoms.   Sleeping  without nocturnal  or early am exacerbation  of respiratory  c/o's or need for noct saba. Also denies any obvious fluctuation of symptoms with weather or environmental changes or other aggravating or alleviating factors except as outlined above   No unusual exposure hx or h/o childhood pna/ asthma or knowledge of premature birth.  Current Allergies, Complete Past Medical History, Past Surgical History, Family History, and Social History were reviewed in [employer] record.  ROS  The following are not active complaints unless bolded Hoarseness, sore throat, dysphagia, dental problems, itching, sneezing,  nasal congestion or discharge of excess mucus or purulent secretions, ear ache,   fever, chills, sweats, unintended wt loss or wt gain, classically pleuritic or exertional cp,  orthopnea pnd or arm/hand swelling  or leg swelling, presyncope, palpitations, abdominal pain, anorexia, nausea, vomiting, diarrhea  or change in bowel habits or change in bladder habits, change in stools or change in urine, dysuria, hematuria,  rash, arthralgias, visual complaints, headache, numbness, weakness or ataxia or problems with walking or coordination,  change in mood or  memory.        Current Meds  Medication Sig   acetaminophen (TYLENOL) 500 MG tablet Take 500 mg by mouth every 6 (six) hours as needed for moderate pain or headache.    Calcium Carb-Cholecalciferol (CALCIUM 600/VITAMIN D3 PO) Take 1 tablet by mouth daily.   Cholecalciferol (VITAMIN  D3) 2000 units TABS Take 2,000 Units by mouth daily.    famotidine (PEPCID) 20 MG tablet One after supper   pantoprazole (PROTONIX) 40 MG tablet Take 1 tablet (40 mg total) by mouth daily. Take 30-60 min before first meal of the day   propranolol (INDERAL) 10 MG tablet Take  10 mg by mouth daily. May take up to 30mg  daily as needed   simvastatin (ZOCOR) 10 MG tablet Take 10 mg by mouth every evening.                       Past Medical History:  Diagnosis Date   Aneurysm ([place]) 1997   no surgery   Anxiety    Benign familial tremor    GERD (gastroesophageal reflux disease)    Headache(784.0)    Hypercholesterolemia       Objective:    02/15/2021        170  05/19/2020      171   03/25/20 172 lb (78 kg)  02/21/20 170 lb (77.1 kg)  06/20/18 176 lb (79.8 kg)    Vital signs reviewed  02/15/2021  - Note at rest 02 sats  97% on RA   General appearance:    amb wf classic voice fatigue/ throat clearing   HEENT : pt wearing mask not removed for exam due to covid -19 concerns.    NECK :  without JVD/Nodes/TM/ nl carotid upstrokes bilaterally   LUNGS: no acc muscle use,  Nl contour chest which is clear to A and P bilaterally without cough on insp or exp maneuvers   CV:  RRR  no s3 or murmur or increase in P2, and no edema   ABD:  soft and nontender with nl inspiratory excursion in the supine position. No bruits or organomegaly appreciated, bowel sounds nl  MS:  Nl gait/ ext warm without deformities, calf tenderness, cyanosis or clubbing No obvious joint restrictions   SKIN: warm and dry without lesions    NEURO:  alert, approp, nl sensorium with  no motor or cerebellar deficits apparent.                   Assessment

## 2021-02-15 NOTE — Assessment & Plan Note (Signed)
Onset 2012 p mold exposure PFT's   04/05/2018 FEV1 1.49 (64 % ) ratio 0.71  p ? prior to study with DLCO  18.15 (79%) corrects to 4.96 (105%)  for alv volume and FV curve min curvature/ erv 17%   - Allergy profile 02/21/2020 >  Eos 0.1 /  IgE  13 neg RAST  - d/c BREO 02/21/2020 and rx with max gerd rx instead with tessalon prn> cough improved but still throat clearing - 03/25/2020 rec gabapentin 100 mg qid >never started - 05/19/2020 rechallenge with gabapentin up to 100 mg tid or whatever dose effective as has concerns re reported side effects with next step > refer to ENT / Joya Gaskins at [facility] voice center - 02/15/2021 rechallenge with gabapentin 100 mg up to 300 mg three times a day and hold there x min 6 weeks then taper off or f/u with DR Joya Gaskins as prev rec           Each maintenance medication was reviewed in detail including emphasizing most importantly the difference between maintenance and prns and under what circumstances the prns are to be triggered using an action plan format where appropriate.  Total time for H and P, chart review, counseling,   and generating customized AVS unique to this office visit / same day charting = 27 min

## 2021-02-15 NOTE — Patient Instructions (Addendum)
Irritable larynx  also called habitual cough or  neurogenic cough    Gabapentin 100 mg up to 300 mg three times a day by adding 100 mg every week using the lowest dose that works strategy and call if you need a stronger pill    Pepcid 20 mg twice daily as needed   If you want to see the expert in [place] for this problem his name is Carol Ada at [facility]

## 2021-02-25 DIAGNOSIS — H3581 Retinal edema: Secondary | ICD-10-CM | POA: Diagnosis not present

## 2021-02-25 DIAGNOSIS — H35371 Puckering of macula, right eye: Secondary | ICD-10-CM | POA: Diagnosis not present

## 2021-02-25 DIAGNOSIS — H47321 Drusen of optic disc, right eye: Secondary | ICD-10-CM | POA: Diagnosis not present

## 2021-02-25 DIAGNOSIS — H34831 Tributary (branch) retinal vein occlusion, right eye, with macular edema: Secondary | ICD-10-CM | POA: Diagnosis not present

## 2021-02-25 DIAGNOSIS — H47211 Primary optic atrophy, right eye: Secondary | ICD-10-CM | POA: Diagnosis not present

## 2021-03-12 ENCOUNTER — Other Ambulatory Visit (HOSPITAL_COMMUNITY): Payer: Self-pay | Admitting: Family Medicine

## 2021-03-12 DIAGNOSIS — Z1231 Encounter for screening mammogram for malignant neoplasm of breast: Secondary | ICD-10-CM

## 2021-04-02 ENCOUNTER — Other Ambulatory Visit: Payer: Self-pay

## 2021-04-02 ENCOUNTER — Ambulatory Visit (HOSPITAL_COMMUNITY)
Admission: RE | Admit: 2021-04-02 | Discharge: 2021-04-02 | Disposition: A | Discharge status: Home / Self Care | Payer: Medicare Other | Source: Ambulatory Visit | Attending: Family Medicine | Admitting: Family Medicine

## 2021-04-02 DIAGNOSIS — Z1231 Encounter for screening mammogram for malignant neoplasm of breast: Secondary | ICD-10-CM | POA: Diagnosis not present

## 2021-05-12 DIAGNOSIS — H35372 Puckering of macula, left eye: Secondary | ICD-10-CM | POA: Diagnosis not present

## 2021-05-12 DIAGNOSIS — H35462 Secondary vitreoretinal degeneration, left eye: Secondary | ICD-10-CM | POA: Diagnosis not present

## 2021-05-12 DIAGNOSIS — H3581 Retinal edema: Secondary | ICD-10-CM | POA: Diagnosis not present

## 2021-05-12 DIAGNOSIS — H35071 Retinal telangiectasis, right eye: Secondary | ICD-10-CM | POA: Diagnosis not present

## 2021-05-12 DIAGNOSIS — H47211 Primary optic atrophy, right eye: Secondary | ICD-10-CM | POA: Diagnosis not present

## 2021-05-25 DIAGNOSIS — E559 Vitamin D deficiency, unspecified: Secondary | ICD-10-CM | POA: Diagnosis not present

## 2021-05-25 DIAGNOSIS — E7849 Other hyperlipidemia: Secondary | ICD-10-CM | POA: Diagnosis not present

## 2021-05-25 DIAGNOSIS — Z6830 Body mass index (BMI) 30.0-30.9, adult: Secondary | ICD-10-CM | POA: Diagnosis not present

## 2021-05-25 DIAGNOSIS — Z1331 Encounter for screening for depression: Secondary | ICD-10-CM | POA: Diagnosis not present

## 2021-05-25 DIAGNOSIS — R7309 Other abnormal glucose: Secondary | ICD-10-CM | POA: Diagnosis not present

## 2021-05-25 DIAGNOSIS — J309 Allergic rhinitis, unspecified: Secondary | ICD-10-CM | POA: Diagnosis not present

## 2021-05-25 DIAGNOSIS — G250 Essential tremor: Secondary | ICD-10-CM | POA: Diagnosis not present

## 2021-05-25 DIAGNOSIS — Z23 Encounter for immunization: Secondary | ICD-10-CM | POA: Diagnosis not present

## 2021-05-25 DIAGNOSIS — E6609 Other obesity due to excess calories: Secondary | ICD-10-CM | POA: Diagnosis not present

## 2021-05-25 DIAGNOSIS — Z0000 Encounter for general adult medical examination without abnormal findings: Secondary | ICD-10-CM | POA: Diagnosis not present

## 2021-05-25 DIAGNOSIS — F419 Anxiety disorder, unspecified: Secondary | ICD-10-CM | POA: Diagnosis not present

## 2021-05-25 DIAGNOSIS — E782 Mixed hyperlipidemia: Secondary | ICD-10-CM | POA: Diagnosis not present

## 2021-05-25 DIAGNOSIS — R259 Unspecified abnormal involuntary movements: Secondary | ICD-10-CM | POA: Diagnosis not present

## 2021-05-25 DIAGNOSIS — J45909 Unspecified asthma, uncomplicated: Secondary | ICD-10-CM | POA: Diagnosis not present

## 2021-06-17 DIAGNOSIS — Z20828 Contact with and (suspected) exposure to other viral communicable diseases: Secondary | ICD-10-CM | POA: Diagnosis not present

## 2021-06-18 ENCOUNTER — Other Ambulatory Visit: Payer: Self-pay | Admitting: Internal Medicine

## 2021-06-30 ENCOUNTER — Other Ambulatory Visit: Payer: Self-pay | Admitting: *Deleted

## 2021-06-30 ENCOUNTER — Other Ambulatory Visit: Payer: Self-pay | Admitting: Internal Medicine

## 2021-06-30 MED ORDER — GABAPENTIN 100 MG PO CAPS
100.0000 mg | ORAL_CAPSULE | Freq: Three times a day (TID) | ORAL | 0 refills | Status: DC
Start: 2021-06-30 — End: 2021-06-30

## 2021-06-30 MED ORDER — GABAPENTIN 100 MG PO CAPS: 100.0000 mg | ORAL_CAPSULE | Freq: Three times a day (TID) | ORAL | 0 refills | Status: DC

## 2021-07-02 ENCOUNTER — Telehealth: Payer: Self-pay | Admitting: Internal Medicine

## 2021-07-02 NOTE — Telephone Encounter (Signed)
Please call the patient for an OV. Thanks She will not be able to get more medication until she has an OV.

## 2021-07-05 NOTE — Telephone Encounter (Signed)
Patient is completely out of medication- today was the first day without the medication. She cut back three days ago because she was going to run out. Patient is scheduled for 12/16 at 10:30am. Can a courtesy refill be sent in for patient until she is able to be seen? Patient states she was told not to stop the medication cold [place]- she needed to slowly take herself off. Please advise.

## 2021-07-06 MED ORDER — GABAPENTIN 100 MG PO CAPS: 100.0000 mg | ORAL_CAPSULE | Freq: Three times a day (TID) | ORAL | 0 refills | Status: DC

## 2021-07-06 NOTE — Telephone Encounter (Signed)
RX REFILLED Pt aware of this and will keep appt for f/u Nothing further needed

## 2021-07-16 ENCOUNTER — Ambulatory Visit (INDEPENDENT_AMBULATORY_CARE_PROVIDER_SITE_OTHER): Payer: Medicare Other | Admitting: Internal Medicine

## 2021-07-16 ENCOUNTER — Other Ambulatory Visit: Payer: Self-pay

## 2021-07-16 ENCOUNTER — Telehealth: Payer: Self-pay

## 2021-07-16 ENCOUNTER — Encounter: Payer: Self-pay | Admitting: Internal Medicine

## 2021-07-16 DIAGNOSIS — R058 Other specified cough: Secondary | ICD-10-CM

## 2021-07-16 MED ORDER — FAMOTIDINE 20 MG PO TABS: ORAL_TABLET | ORAL | 11 refills | Status: AC

## 2021-07-16 MED ORDER — PANTOPRAZOLE SODIUM 40 MG PO TBEC: 40.0000 mg | DELAYED_RELEASE_TABLET | Freq: Every day | ORAL | 2 refills | Status: DC

## 2021-07-16 MED ORDER — GABAPENTIN 100 MG PO CAPS: 100.0000 mg | ORAL_CAPSULE | Freq: Four times a day (QID) | ORAL | 3 refills | Status: DC

## 2021-07-16 NOTE — Assessment & Plan Note (Signed)
Onset 2012 p mold exposure PFT's   04/05/2018 FEV1 1.49 (64 % ) ratio 0.71  p ? prior to study with DLCO  18.15 (79%) corrects to 4.96 (105%)  for alv volume and FV curve min curvature/ erv 17%   - Allergy profile 02/21/2020 >  Eos 0.1 /  IgE  13 neg RAST  - d/c BREO 02/21/2020 and rx with max gerd rx instead with tessalon prn> cough improved but still throat clearing - 03/25/2020 rec gabapentin 100 mg qid >never started - 05/19/2020 rechallenge with gabapentin up to 100 mg tid or whatever dose effective as has concerns re reported side effects with next step > refer to ENT / Joya Gaskins at [facility] voice center - 02/15/2021 rechallenge with gabapentin 100 mg up to 300 mg three times a day and hold there x min 6 weeks then taper off or f/u with DR Joya Gaskins as prev rec   - 07/16/2021 reported improvement on 100 mg bid but refused to titrate high enough to eliminate throat clearing and requested to change providers   The standardized cough guidelines published in Chest by Lissa Morales in 2006 are still the best available and consist of a multiple step process (up to 12!) , not a single office visit,  and are intended  to address this problem logically,  with an alogrithm dependent on response to empiric treatment at  each progressive step  to determine a specific diagnosis with  minimal addtional testing needed. Therefore if adherence is an issue or can't be accurately verified,  it's very unlikely the standard evaluation and treatment will be successful here.    Furthermore, response to therapy (other than acute cough suppression, which should only be used short term with avoidance of narcotic containing cough syrups if possible), can be a gradual process for which the patient is not likely to  perceive immediate benefit.  Unlike going to an eye doctor where the best perscription is almost always the first one and is immediately effective, this is almost never the case in the management of chronic cough syndromes.  Therefore the patient needs to commit up front to consistently adhere to recommendations  for up to 6 weeks of therapy directed at the likely underlying problem(s) before the response can be reasonably evaluated.   She does not appear willing to following recs to eliminate cyclical cough though I reviewed them line by line with explanation for each and only informed my nurse p ov that she was requesting to change providers so I will see her back if she changes her mind.          Each maintenance medication was reviewed in detail including emphasizing most importantly the difference between maintenance and prns and under what circumstances the prns are to be triggered using an action plan format where appropriate.  Total time for H and P, chart review, counseling,  and generating customized AVS unique to this sum[place] and apparently final  office visit / same day charting > 30 min

## 2021-07-16 NOTE — Patient Instructions (Addendum)
Pantoprazole (protonix) 40 mg   Take  30-60 min before first meal of the day and Pepcid (famotidine)  20 mg after supper until return to office - this is the best way to tell whether stomach acid is contributing to your problem.    Bed blocks     Gabapentin 100 mg up to 300 mg three times a day by adding 100 mg every week using the lowest dose that works strategy and call if you need a stronger pill    Please schedule a follow up visit in 3 months but call sooner if needed

## 2021-07-16 NOTE — Telephone Encounter (Signed)
Ok with me 

## 2021-07-16 NOTE — Telephone Encounter (Signed)
Patient would like to switch providers from Dr. Melvyn Novas to Dr. Halford Chessman.   Please advise if this is okay. Thanks!   Scheduled patient for appt with Dr. Halford Chessman before she left office today (by pt request) but let her know that we will call if any changes need to be made.

## 2021-07-16 NOTE — Progress Notes (Signed)
Kaitlyn Glass, female    DOB: Aug 22, 1950    MRN: [id number]   Brief patient profile:  65 yowf never smoker never allergies with onset of cough p exposure to mold around 2012 and out of that environment since 2018 but not improved >>  eval by [name] and rx Breo 200 and no better so referred to pulmonary clinic 02/21/2020 by Dr   [name]    History of Present Illness  02/21/2020  Pulmonary/ 1st office eval/Pruitt Taboada / [name] on BREO 200 Chief Complaint  Patient presents with   Consult    Patient is here for cough that she has had for 9 years. Hoarseness over the last couple months that is new. Saw Dr. [name]. Some shortness of breath with exertion. Wheezing in sleep. Productive cough worse in morning with thick white sputum  Dyspnea:   Steps give her trouble  but not flat surfaces / worse when hot / humid walks up to a mile a week prior to OV   Cough: during the day, mostly dry hack / assoc with hoarseness / very sensitive to grainy food even salt grains can make her cough  Nose feels clear  Sleep: bed is flat and 2 pillows / no resp ccs SABA use: none  Sp EGD dilation 2019 not using PPI (insurance doesn't cover Nexium, "it ruins your lungs and liver, right?") rec Stop Breo - ok to go back on it if cough gets worse after a week  Pantoprazole (protonix) 40 mg  Take  30-60 min before first meal of the day and Pepcid (famotidine)  20 mg one after supper  until return to office   Prednisone 10 mg take  4 each am x 2 days,   2 each am x 2 days,  1 each am x 2 days and stop  For cough > try tessalon 200 mg one every 6 hours as needed  GERD  Please remember to go to the lab department at [facility]    for your tests - we will call you with the results when they are available.     03/25/2020  f/u ov/[place] office/Akyia Borelli re: cough daily x 2012 ? Some better p retired / better p prednisone but still urge to clear throat  Chief Complaint  Patient presents with   Follow-up    Cough is slightly improved.  Hoarsness unchanged. She stopped tessalon since made her too drowsy.   Dyspnea:  Not limited by breathing from desired activities   Cough: worse bending over / dry  Sleeping: bed is flat/ one pillow no problem noct cough  SABA use: none  02: none  Taking inderol 10-20 mg daily  rec Gabapentin 100 mg build up to 4  times  Start with bedtime dose rec Gabapentin 100 mg build up to 4  times  Start with bedtime dose Prednisone 10 mg take  4 each am x 2 days,   2 each am x 2 days,  1 each am x 2 days and stop  [employer]er ranchers or lifesavers fruit flavored     05/19/2020  f/u ov/Lucien Budney re: cough x 2012 did not use gabapentin yet  Chief Complaint  Patient presents with   Follow-up    Cough is overall doing better. When she does cough it is non prod.   Dyspnea:  Much better at [facility] x when exposed to wood burning fires / only sob when coughing  Cough: dry / worse when ben over  Day >> noct  Sleeping: bed is flat two pillows  SABA use: none  02: none  Rec Start gabapentin 100 mg at bedtime for a week then twice daily for a week then    one three times a day - continue the lowest dose that works  Once the cough is gone wean off the acid suppression starting with pantoprazole and replace with pepcid after breakfast for a week then drop the supper dose for a week and then the am dose  Keep the candy handy Please schedule a follow up visit in 3 months but call sooner if needed - call for referral to Carol Ada at [facility] if not making progress     02/15/2021  f/u ov/[place] office/Shera Laubach re: cough x 2012 maint on pepcid prn  Chief Complaint  Patient presents with   Follow-up    Cough has improved overall since the last visit. She states cough comes and goes.    Dyspnea:  Not limited by breathing from desired activities   Cough: worse bending over, worse day than night/ worse with voice use  Sleeping: ok flat bed two pillow occ am cough but it doesn't disturb  SABA use: none 02: no  Covid  status: vax x 2  Lung cancer screening: never smoker  Gabapentin 100 mg one daily did not work and so stopped it  Rec Irritable larynx  also called habitual cough or  neurogenic cough  Gabapentin 100 mg up to 300 mg three times a day by adding 100 mg every week using the lowest dose that works strategy and call if you need a stronger pill   Pepcid 20 mg twice daily as needed  If you want to see the expert in [place] for this problem his name is Carol Ada at [facility]     07/16/2021  f/u ov/[place] office/[facility] Teegarden re: uacs vs cough variant asthma maint on gabapentin 100 mg bid (never titrated)  "best she's been but still coughing"  Chief Complaint  Patient presents with   Follow-up    Dry cough has continued to improve.   Dyspnea:  good ex tolerance including aerobics Cough: day > night and mostly dry  worse with perfumes Sleeping: bed is flat bed/ 2 pillows  SABA use: none  02: none  Covid status: vax x 3      No obvious day to day or daytime variability or assoc excess/ purulent sputum or mucus plugs or hemoptysis or cp or chest tightness, subjective wheeze or overt sinus or hb symptoms.   Sleeping  without nocturnal  or early am exacerbation  of respiratory  c/o's or need for noct saba. Also denies any obvious fluctuation of symptoms with weather or environmental changes or other aggravating or alleviating factors except as outlined above   No unusual exposure hx or h/o childhood pna/ asthma or knowledge of premature birth.  Current Allergies, Complete Past Medical History, Past Surgical History, Family History, and Social History were reviewed in [employer] record.  ROS  The following are not active complaints unless bolded Hoarseness, sore throat, dysphagia, dental problems, itching, sneezing,  nasal congestion or discharge of excess mucus or purulent secretions, ear ache,   fever, chills, sweats, unintended wt loss or wt gain, classically pleuritic or  exertional cp,  orthopnea pnd or arm/hand swelling  or leg swelling, presyncope, palpitations, abdominal pain, anorexia, nausea, vomiting, diarrhea  or change in bowel habits or change in bladder habits, change in stools or change in urine, dysuria, hematuria,  rash, arthralgias, visual complaints, headache, numbness, weakness or ataxia or problems with walking or coordination,  change in mood or  memory.        Current Meds  Medication Sig   acetaminophen (TYLENOL) 500 MG tablet Take 500 mg by mouth every 6 (six) hours as needed for moderate pain or headache.    Calcium Carb-Cholecalciferol (CALCIUM 600/VITAMIN D3 PO) Take 1 tablet by mouth daily.   Cholecalciferol (VITAMIN D3) 2000 units TABS Take 2,000 Units by mouth daily.    famotidine (PEPCID) 20 MG tablet One after supper   gabapentin (NEURONTIN) 100 MG capsule Take 1 capsule (100 mg total) by mouth 3 (three) times daily.   propranolol (INDERAL) 10 MG tablet Take 10 mg by mouth daily. May take up to 30mg  daily as needed   simvastatin (ZOCOR) 10 MG tablet Take 10 mg by mouth every evening.                 Past Medical History:  Diagnosis Date   Aneurysm ([place]) 1997   no surgery   Anxiety    Benign familial tremor    GERD (gastroesophageal reflux disease)    Headache(784.0)    Hypercholesterolemia       Objective:    Wts  07/16/2021      172  02/15/2021        170  05/19/2020      171   03/25/20 172 lb (78 kg)  02/21/20 170 lb (77.1 kg)  06/20/18 176 lb (79.8 kg)     Vital signs reviewed  07/16/2021  - Note at rest 02 sats  98% on RA   General appearance:   somber  amb wf raspy voice / freq throat clearing with very poor insight into how/ when to take meds    HEENT : pt wearing mask not removed for exam due to covid -19 concerns.    NECK :  without JVD/Nodes/TM/ nl carotid upstrokes bilaterally   LUNGS: no acc muscle use,  Nl contour chest which is clear to A and P bilaterally without cough on insp or exp  maneuvers   CV:  RRR  no s3 or murmur or increase in P2, and no edema   ABD:  soft and nontender with nl inspiratory excursion in the supine position. No bruits or organomegaly appreciated, bowel sounds nl  MS:  Nl gait/ ext warm without deformities, calf tenderness, cyanosis or clubbing No obvious joint restrictions   SKIN: warm and dry without lesions    NEURO:  alert, approp, nl sensorium with  no motor or cerebellar deficits apparent.                     Assessment

## 2021-07-19 NOTE — Telephone Encounter (Signed)
Okay with me 

## 2021-08-16 ENCOUNTER — Ambulatory Visit: Payer: Medicare Other | Admitting: Diagnostic Neuroimaging

## 2021-08-17 ENCOUNTER — Encounter: Payer: Self-pay | Admitting: *Deleted

## 2021-08-18 ENCOUNTER — Ambulatory Visit (INDEPENDENT_AMBULATORY_CARE_PROVIDER_SITE_OTHER): Payer: Medicare Other | Admitting: Diagnostic Neuroimaging

## 2021-08-18 ENCOUNTER — Encounter: Payer: Self-pay | Admitting: Diagnostic Neuroimaging

## 2021-08-18 VITALS — BP 120/84 | HR 72 | Ht 62.5 in | Wt 171.8 lb

## 2021-08-18 DIAGNOSIS — G250 Essential tremor: Secondary | ICD-10-CM

## 2021-08-18 NOTE — Patient Instructions (Addendum)
°  ESSENTIAL TREMOR  - increase propranolol up to 10mg  three times a day; then may increase up to 20mg  twice a day   - may consider primidone in future  - may consider DBS evaluation in future

## 2021-08-18 NOTE — Progress Notes (Signed)
[facility]  PATIENT: Kaitlyn Glass DOB: [birth year]  REFERRING CLINICIAN: Sharilyn Sites, MD HISTORY FROM: patient  REASON FOR VISIT: new consult   HISTORICAL  CHIEF COMPLAINT:  Chief Complaint  Patient presents with   Tremors    Rm 6 New Pt husband- [name] "tremors sometimes most of my life, gotten worse over last year, issues with writing, taking propranolol for it but they are getting worse"     HISTORY OF PRESENT ILLNESS:   71 year old right handed female here for evaluation of tremor.  Patient has had postural tremor in arms since childhood.  This has gradually worsened over time.  Symptoms more noted in left greater than right side.  Family history of tremor in father and brother.  She has tried propranolol 10 mg twice a day with mild benefit.  Sometimes she takes 2-1/2 or 3 tablets a day.  Patient also has some right thumb pain and numbness which wakes her up from sleep.   REVIEW OF SYSTEMS: Full 14 system review of systems performed and negative with exception of: as per HPI.  ALLERGIES: Allergies  Allergen Reactions   Codeine Nausea Only   Penicillins Hives, Swelling and Other (See Comments)    HIVES AND SWELLING OF THE THROAT Has patient had a PCN reaction causing immediate rash, facial/tongue/throat swelling, SOB or lightheadedness with hypotension: Yes Has patient had a PCN reaction causing severe rash involving mucus membranes or skin necrosis: No Has patient had a PCN reaction that required hospitalization: Yes- Urgent care Has patient had a PCN reaction occurring within the last 10 years: No If all of the above answers are "NO", then may proceed with Cephalosporin use.    Atorvastatin     HOME MEDICATIONS: Outpatient Medications Prior to Visit  Medication Sig Dispense Refill   acetaminophen (TYLENOL) 500 MG tablet Take 500 mg by mouth every 6 (six) hours as needed for moderate pain or headache.      Calcium Carb-Cholecalciferol  (CALCIUM 600/VITAMIN D3 PO) Take 1 tablet by mouth daily.     Cholecalciferol (VITAMIN D3) 2000 units TABS Take 2,000 Units by mouth daily.      famotidine (PEPCID) 20 MG tablet One after supper 30 tablet 11   propranolol (INDERAL) 10 MG tablet Take 10 mg by mouth daily. May take up to 30mg  daily as needed     simvastatin (ZOCOR) 10 MG tablet Take 10 mg by mouth every evening.      gabapentin (NEURONTIN) 100 MG capsule Take 1 capsule (100 mg total) by mouth 4 (four) times daily. (Patient not taking: Reported on 08/18/2021) 120 capsule 3   pantoprazole (PROTONIX) 40 MG tablet Take 1 tablet (40 mg total) by mouth daily. Take 30-60 min before first meal of the day (Patient not taking: Reported on 08/18/2021) 30 tablet 2   No facility-administered medications prior to visit.    PAST MEDICAL HISTORY: Past Medical History:  Diagnosis Date   Aneurysm ([place]) 1997   no surgery   Anxiety    Benign familial tremor    GERD (gastroesophageal reflux disease)    Headache(784.0)    Hypercholesterolemia    Tremor     PAST SURGICAL HISTORY: Past Surgical History:  Procedure Laterality Date   BIOPSY  02/19/2018   Procedure: BIOPSY;  Surgeon: Danie Binder, MD;  Location: [facility];  Service: Endoscopy;;  gastric   BREAST BIOPSY Right 2005   negative for CA   COLONOSCOPY  2006   Family Hx  Colon Cancer - Brother AGE > 102   COLONOSCOPY  02/25/2011   Dr. Oneida Alar: sessile hyperplastic rectal polyp, surveillance in 5-10 years   COLONOSCOPY N/A 02/19/2018   Procedure: COLONOSCOPY;  Surgeon: Danie Binder, MD;  Location: [facility];  Service: Endoscopy;  Laterality: N/A;  12:00pm   ESOPHAGOGASTRODUODENOSCOPY N/A 02/19/2018   Procedure: ESOPHAGOGASTRODUODENOSCOPY (EGD);  Surgeon: Danie Binder, MD;  Location: [facility];  Service: Endoscopy;  Laterality: N/A;   ESOPHAGOGASTRODUODENOSCOPY ENDOSCOPY     EYE SURGERY     SAVORY DILATION N/A 02/19/2018   Procedure: SAVORY DILATION;  Surgeon: Danie Binder, MD;  Location: [facility];  Service: Endoscopy;  Laterality: N/A;    FAMILY HISTORY: Family History  Problem Relation Age of Onset   Breast cancer Sister    Parkinson's disease Sister        in 1 sister   Colon cancer Brother        age >  74 (diagnosed at age 22)   Tremor Brother    Throat cancer Brother    Colon polyps Neg Hx     SOCIAL HISTORY: Social History   Socioeconomic History   Marital status: Married    Spouse name: [name]   Number of children: 2   Years of education: Not on file   Highest education level: Associate degree: academic program  Occupational History    Comment: retired  Tobacco Use   Smoking status: Never   Smokeless tobacco: Never  Vaping Use   Vaping Use: Never used  Substance and Sexual Activity   Alcohol use: No   Drug use: Never   Sexual activity: Yes    Birth control/protection: None  Other Topics Concern   Not on file  Social History Narrative   Lives with husband   [place]. 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 HOBBIES: CROCHETS, QUILT, READ. ONE KID: DAUGHTER(AGE 22)-LIVES IN [place]. NO GRAND KIDS YET.   Social Determinants of Health   Financial Resource Strain: Not on file  Food Insecurity: Not on file  Transportation Needs: Not on file  Physical Activity: Not on file  Stress: Not on file  Social Connections: Not on file  Intimate Partner Violence: Not on file     PHYSICAL EXAM  GENERAL EXAM/CONSTITUTIONAL: Vitals:  Vitals:   08/18/21 1128  BP: 120/84  Pulse: 72  Weight: 171 lb 12.8 oz (77.9 kg)  Height: 5' 2.5" (1.588 m)   Body mass index is 30.92 kg/m. Wt Readings from Last 3 Encounters:  08/18/21 171 lb 12.8 oz (77.9 kg)  07/16/21 172 lb 3.2 oz (78.1 kg)  02/15/21 170 lb (77.1 kg)   Patient is in no distress; well developed, nourished and groomed; neck is supple  CARDIOVASCULAR: Examination of carotid arteries is  normal; no carotid bruits Regular rate and rhythm, no murmurs Examination of peripheral vascular system by observation and palpation is normal  EYES: Ophthalmoscopic exam of optic discs and posterior segments is normal; no papilledema or hemorrhages No results found.  MUSCULOSKELETAL: Gait, strength, tone, movements noted in Neurologic exam below  NEUROLOGIC: MENTAL STATUS:  No flowsheet data found. awake, alert, oriented to person, place and time recent and remote memory intact normal attention and concentration language fluent, comprehension intact, naming intact fund of knowledge appropriate  CRANIAL NERVE:  2nd - no papilledema on fundoscopic exam 2nd, 3rd, 4th, 6th - pupils equal and reactive  to light, visual fields full to confrontation, extraocular muscles intact, no nystagmus 5th - facial sensation symmetric 7th - facial strength symmetric 8th - hearing intact 9th - palate elevates symmetrically, uvula midline 11th - shoulder shrug symmetric 12th - tongue protrusion midline  MOTOR:  normal bulk and tone, full strength in the BUE, BLE; EXCEPT SUBTLE ATROPHY AND WEAKNESS OF RIGHT APB MODERATE POSTURAL AND SEVERE ACTION TREMOR IN BUE  SENSORY:  normal and symmetric to light touch, temperature, vibration  COORDINATION:  finger-nose-finger, fine finger movements normal  REFLEXES:  deep tendon reflexes trace and symmetric  GAIT/STATION:  narrow based gait     DIAGNOSTIC DATA (LABS, IMAGING, TESTING) - I reviewed patient records, labs, notes, testing and imaging myself where available.  Lab Results  Component Value Date   WBC 13.9 (H) 02/21/2020   HGB 13.8 02/21/2020   HCT 44.2 02/21/2020   MCV 94.4 02/21/2020   PLT 383 02/21/2020   No results found for: NA, K, CL, CO2, GLUCOSE, BUN, CREATININE, CALCIUM, PROT, ALBUMIN, AST, ALT, ALKPHOS, BILITOT, GFRNONAA, GFRAA No results found for: CHOL, HDL, LDLCALC, LDLDIRECT, TRIG, CHOLHDL No results found for:  HGBA1C No results found for: VITAMINB12 No results found for: TSH     ASSESSMENT AND PLAN  71 y.o. year old female here with longstanding benign essential tremor.  No sign of parkinsonism.   Dx:  1. Tremor, essential     PLAN:  ESSENTIAL TREMOR - increase propranolol up to 10mg  three times a day; then may increase up to 20mg  twice a day  - may consider primidone in future - may consider DBS evaluation in future  MILD RIGHT THUMB PAIN / NUMBNESS - could represent mild right carpal tunnel syndrome; consider wrist splint at bedtime; if worsens, can consider EMG/NCS evaluation  Return for pending if symptoms worsen or fail to improve.  I spent 48 minutes of face-to-face and non-face-to-face time with patient.  This included previsit chart review, lab review, study review, order entry, electronic health record documentation, patient education.     Penni Bombard, MD 1/43/8887, 57:97 PM Certified in Neurology, Neurophysiology and Neuroimaging  [facility] [address] [place], [place] [address] [phone]

## 2021-09-24 DIAGNOSIS — H43812 Vitreous degeneration, left eye: Secondary | ICD-10-CM | POA: Diagnosis not present

## 2021-09-24 DIAGNOSIS — H47211 Primary optic atrophy, right eye: Secondary | ICD-10-CM | POA: Diagnosis not present

## 2021-09-24 DIAGNOSIS — H3581 Retinal edema: Secondary | ICD-10-CM | POA: Diagnosis not present

## 2021-09-24 DIAGNOSIS — Z961 Presence of intraocular lens: Secondary | ICD-10-CM | POA: Diagnosis not present

## 2021-09-24 DIAGNOSIS — H35372 Puckering of macula, left eye: Secondary | ICD-10-CM | POA: Diagnosis not present

## 2021-09-27 ENCOUNTER — Other Ambulatory Visit: Payer: Self-pay

## 2021-09-27 ENCOUNTER — Encounter: Payer: Self-pay | Admitting: Pulmonary Disease

## 2021-09-27 ENCOUNTER — Ambulatory Visit (HOSPITAL_COMMUNITY)
Admission: RE | Admit: 2021-09-27 | Discharge: 2021-09-27 | Disposition: A | Discharge status: Home / Self Care | Payer: Medicare Other | Source: Ambulatory Visit | Attending: Pulmonary Disease | Admitting: Pulmonary Disease

## 2021-09-27 ENCOUNTER — Ambulatory Visit (INDEPENDENT_AMBULATORY_CARE_PROVIDER_SITE_OTHER): Payer: Medicare Other | Admitting: Pulmonary Disease

## 2021-09-27 VITALS — BP 134/78 | HR 90 | Temp 98.2°F | Ht 62.0 in | Wt 173.0 lb

## 2021-09-27 DIAGNOSIS — Z20822 Contact with and (suspected) exposure to covid-19: Secondary | ICD-10-CM | POA: Diagnosis not present

## 2021-09-27 DIAGNOSIS — R053 Chronic cough: Secondary | ICD-10-CM

## 2021-09-27 NOTE — Patient Instructions (Signed)
Lab tests and chest xray today  Will schedule pulmonary function test at [facility] water gargle twice per day  Sip water when you have the urge to cough  1 teaspoon of local honey twice per day  Follow up in 4 months

## 2021-09-27 NOTE — Progress Notes (Signed)
[facility]  Chief Complaint  Patient presents with   New Patient (Initial Visit)    New patient from Kaitlyn Glass to Kaitlyn Glass for cough. States cough is about the same.     Past Surgical History:  She  has a past surgical history that includes Colonoscopy (2006); Colonoscopy (02/25/2011); Eye surgery; Esophagogastroduodenoscopy endoscopy; Colonoscopy (N/A, 02/19/2018); Esophagogastroduodenoscopy (N/A, 02/19/2018); Savory dilation (N/A, 02/19/2018); biopsy (02/19/2018); and Breast biopsy (Right, 2005).  Past Medical History:  Anxiety, Tremor, GERD, Headache, HLD, PUD, Esophageal stricture s/p dilation  Constitutional:  BP 134/78 (BP Location: Left Arm, Patient Position: Sitting)    Pulse 90    Temp 98.2 F (36.8 C) (Temporal)    Ht 5\' 2"  (1.575 m)    Wt 173 lb (78.5 kg)    SpO2 97% Comment: ra   BMI 31.64 kg/m   Brief Summary:  Kaitlyn Glass is a 71 y.o. female with chronic cough.      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  This was in 2012.  She developed a cough then and this has persisted.  She no longer works in that building.  She gets a cough when it is windy or when she goes out in cold weather.  Also gets a cough around strong cooking smells.  Her cough happens sometimes when she talks for a while.  She hears herself wheezing in her chest sometimes and brings up clear sputum sometimes.  Not having allergies, sinus congestion, sneezing, or post nasal drip.  She is sleeping okay.  Denies skin rash.  Gets hives if she takes penicillin.  No other food or medication allergies.  She used breo before, but didn't seem to make a difference with cough and made her tremor worse.  She has intermittent heart burn.  Uses an antacid few time per week.  She was seen previously by Kaitlyn Glass with GI and had dilation of her esophagus.  She reports having a stomach ulcer years ago.  She typically gets a tickle in her throat and then  coughs.  This happens more when she feels anxious.  Chest xray from 03/25/20 showed coarse interstitial markings.  Previous PFT showed evidence for airflow obstruction.  Her previous CT chest showed bronchial thickening.  Physical Exam:   Appearance - well kempt   ENMT - no sinus tenderness, no oral exudate, no LAN, Mallampati 3 airway, no stridor  Respiratory - equal breath sounds bilaterally, no wheezing or rales  CV - s1s2 regular rate and rhythm, no murmurs  Ext - no clubbing, no edema  Skin - no rashes  Psych - normal mood and affect   Pulmonary testing:  PFT 04/05/18 >> FEV1 1.49 (64%), FEV1% 71, TLC 6.07 (123%), RV 4.01 (195%), DLCO 79%  Chest Imaging:  HRCT chest 04/20/18 >> scarring in RML and Lingula, few scattered nodules up to 5 mm, diffuse bronchial wall thickening, fatty liver  Social History:  She  reports that she has never smoked. She has never used smokeless tobacco. She reports that she does not drink alcohol and does not use drugs.  Family History:  Her family history includes Breast cancer in her sister; Colon cancer in her brother; Parkinson's disease in her sister; Throat cancer in her brother; Tremor in her brother.    Discussion:  She has chronic cough since 2012 after initial exposure to mold in her work place.  She never smoked cigarettes.  Her symptoms aren't consistent with post nasal drip.  She reports difficulty tolerating antihistamine medications due to hypersomnolence.  Her previous PFT and CT imaging are suggestive of asthma.  She reports intolerance of breo due to worsening tremor, but this is likely from LABA component.  She has history of PUD and reflux, and uses antacids several times per week.  Her reflux could be contributing to her cough.  Lastly, she likely has a component of neuropathic irritability in her upper airway causing hypersensitivity to various stimuli that trigger her cough.  Assessment/Plan:   Chronic cough. - will arrange  for CBC with diff, CMET, IgE, Chest xray and PFT w/o post-bronchodilator challenge - salt water gargle bid - 1 tsp local honey bid - sip water with urge to cough - previous trial of breo caused worsening tremor; should still be able to tolerate ICS if needed - advised her to contact gastroenterology to arrange for further assessment of reflux - might need bronchoscopy to assess for airway eosinophilia - if her evaluation is unrevealing for a specific cause, then might need to restart medications for neuropathic component  Essential tremor. - followed by Kaitlyn Glass with Kaitlyn Glass  Time Spent Involved in Patient Care on Day of Examination:  46 minutes  Follow up:   Patient Instructions  Lab tests and chest xray today  Will schedule pulmonary function test at [facility] water gargle twice per day  Sip water when you have the urge to cough  1 teaspoon of local honey twice per day  Follow up in 4 months  Medication List:   Allergies as of 09/27/2021       Reactions   Codeine Nausea Only   Penicillins Hives, Swelling, Other (See Comments)   HIVES AND SWELLING OF THE THROAT Has patient had a PCN reaction causing immediate rash, facial/tongue/throat swelling, SOB or lightheadedness with hypotension: Yes Has patient had a PCN reaction causing severe rash involving mucus membranes or skin necrosis: No Has patient had a PCN reaction that required hospitalization: Yes- Urgent care Has patient had a PCN reaction occurring within the last 10 years: No If all of the above answers are "NO", then may proceed with Cephalosporin use.   Atorvastatin    Patient unsure of accuracy         Medication List        Accurate as of September 27, 2021 11:17 AM. If you have any questions, ask your nurse or doctor.          acetaminophen 500 MG tablet Commonly known as: TYLENOL Take 500 mg by mouth every 6 (six) hours as needed for moderate pain or  headache.   CALCIUM 600/VITAMIN D3 PO Take 1 tablet by mouth daily.   famotidine 20 MG tablet Commonly known as: Pepcid One after supper   pantoprazole 40 MG tablet Commonly known as: Protonix Take 1 tablet (40 mg total) by mouth daily. Take 30-60 min before first meal of the day   propranolol 10 MG tablet Commonly known as: INDERAL Take 10 mg by mouth daily. May take up to 30mg  daily as needed   simvastatin 10 MG tablet Commonly known as: ZOCOR Take 10 mg by mouth every evening.   Vitamin D3 50 MCG (2000 UT) Tabs Take 2,000 Units by mouth daily.        Signature:  [name], MD [place] Pager - [phone] 09/27/2021, 11:17 AM

## 2021-10-01 LAB — COMPREHENSIVE METABOLIC PANEL
ALT: 14 IU/L (ref 0–32)
AST: 15 IU/L (ref 0–40)
Albumin/Globulin Ratio: 1.8 (ref 1.2–2.2)
Albumin: 4.4 g/dL (ref 3.8–4.8)
Alkaline Phosphatase: 104 IU/L (ref 44–121)
BUN/Creatinine Ratio: 24 (ref 12–28)
BUN: 17 mg/dL (ref 8–27)
Bilirubin Total: 0.3 mg/dL (ref 0.0–1.2)
CO2: 27 mmol/L (ref 20–29)
Calcium: 10.3 mg/dL (ref 8.7–10.3)
Chloride: 102 mmol/L (ref 96–106)
Creatinine, Ser: 0.71 mg/dL (ref 0.57–1.00)
Globulin, Total: 2.5 g/dL (ref 1.5–4.5)
Glucose: 74 mg/dL (ref 70–99)
Potassium: 5.2 mmol/L (ref 3.5–5.2)
Sodium: 145 mmol/L — ABNORMAL HIGH (ref 134–144)
Total Protein: 6.9 g/dL (ref 6.0–8.5)
eGFR: 92 mL/min/{1.73_m2} (ref >59)

## 2021-10-01 LAB — CBC WITH DIFFERENTIAL/PLATELET
Basophils Absolute: 0.1 10*3/uL (ref 0.0–0.2)
Basos: 0 %
EOS (ABSOLUTE): 0.1 10*3/uL (ref 0.0–0.4)
Eos: 1 %
Hematocrit: 43.7 % (ref 34.0–46.6)
Hemoglobin: 14.2 g/dL (ref 11.1–15.9)
Immature Grans (Abs): 0 10*3/uL (ref 0.0–0.1)
Immature Granulocytes: 0 %
Lymphocytes Absolute: 2.2 10*3/uL (ref 0.7–3.1)
Lymphs: 19 %
MCH: 28.9 pg (ref 26.6–33.0)
MCHC: 32.5 g/dL (ref 31.5–35.7)
MCV: 89 fL (ref 79–97)
Monocytes Absolute: 1.2 10*3/uL — ABNORMAL HIGH (ref 0.1–0.9)
Monocytes: 10 %
Neutrophils Absolute: 8 10*3/uL — ABNORMAL HIGH (ref 1.4–7.0)
Neutrophils: 70 %
Platelets: 410 10*3/uL (ref 150–450)
RBC: 4.92 x10E6/uL (ref 3.77–5.28)
RDW: 12.2 % (ref 11.7–15.4)
WBC: 11.6 10*3/uL — ABNORMAL HIGH (ref 3.4–10.8)

## 2021-10-01 LAB — IGE: IgE (Immunoglobulin E), Serum: 10 IU/mL (ref 6–495)

## 2021-11-02 DIAGNOSIS — Z20822 Contact with and (suspected) exposure to covid-19: Secondary | ICD-10-CM | POA: Diagnosis not present

## 2021-11-03 ENCOUNTER — Encounter: Payer: Self-pay | Admitting: Adult Health

## 2021-11-03 ENCOUNTER — Ambulatory Visit (INDEPENDENT_AMBULATORY_CARE_PROVIDER_SITE_OTHER): Payer: Medicare Other | Admitting: Pulmonary Disease

## 2021-11-03 ENCOUNTER — Ambulatory Visit (INDEPENDENT_AMBULATORY_CARE_PROVIDER_SITE_OTHER): Payer: Medicare Other | Admitting: Adult Health

## 2021-11-03 VITALS — BP 132/62 | HR 66 | Temp 97.7°F | Ht 62.5 in | Wt 170.0 lb

## 2021-11-03 DIAGNOSIS — R053 Chronic cough: Secondary | ICD-10-CM

## 2021-11-03 DIAGNOSIS — R058 Other specified cough: Secondary | ICD-10-CM | POA: Diagnosis not present

## 2021-11-03 DIAGNOSIS — K219 Gastro-esophageal reflux disease without esophagitis: Secondary | ICD-10-CM

## 2021-11-03 DIAGNOSIS — R059 Cough, unspecified: Secondary | ICD-10-CM

## 2021-11-03 LAB — PULMONARY FUNCTION TEST
DL/VA % pred: 125 %
DL/VA: 5.27 ml/min/mmHg/L
DLCO cor % pred: 95 %
DLCO cor: 17.67 ml/min/mmHg
DLCO unc % pred: 95 %
DLCO unc: 17.67 ml/min/mmHg
FEF 25-75 Post: 0.98 L/sec
FEF 25-75 Pre: 1.13 L/sec
FEF2575-%Change-Post: -12 %
FEF2575-%Pred-Post: 52 %
FEF2575-%Pred-Pre: 60 %
FEV1-%Change-Post: 2 %
FEV1-%Pred-Post: 67 %
FEV1-%Pred-Pre: 66 %
FEV1-Post: 1.46 L
FEV1-Pre: 1.43 L
FEV1FVC-%Change-Post: -4 %
FEV1FVC-%Pred-Pre: 98 %
FEV6-%Change-Post: 4 %
FEV6-%Pred-Post: 72 %
FEV6-%Pred-Pre: 69 %
FEV6-Post: 1.98 L
FEV6-Pre: 1.9 L
FEV6FVC-%Change-Post: -2 %
FEV6FVC-%Pred-Post: 101 %
FEV6FVC-%Pred-Pre: 104 %
FVC-%Change-Post: 6 %
FVC-%Pred-Post: 71 %
FVC-%Pred-Pre: 67 %
FVC-Post: 2.03 L
FVC-Pre: 1.91 L
Post FEV1/FVC ratio: 72 %
Post FEV6/FVC ratio: 97 %
Pre FEV1/FVC ratio: 75 %
Pre FEV6/FVC Ratio: 100 %
RV % pred: 154 %
RV: 3.24 L
TLC % pred: 109 %
TLC: 5.27 L

## 2021-11-03 MED ORDER — BENZONATATE 200 MG PO CAPS: 200.0000 mg | ORAL_CAPSULE | Freq: Three times a day (TID) | ORAL | 1 refills | Status: DC | PRN

## 2021-11-03 MED ORDER — FLUTICASONE PROPIONATE HFA 44 MCG/ACT IN AERO: 1.0000 | INHALATION_SPRAY | Freq: Two times a day (BID) | RESPIRATORY_TRACT | 5 refills | Status: DC

## 2021-11-03 NOTE — Progress Notes (Signed)
Full PFT performed today. °

## 2021-11-03 NOTE — Progress Notes (Signed)
? ?'@Patient'$  ID: Kaitlyn Glass, female    DOB: 17-May-1951, 71 y.o.   MRN: [id number] ? ?Chief Complaint  ?Patient presents with  ? Follow-up  ? ? ?Referring provider: ?Sharilyn Sites, MD ? ?HPI: ?71 year old female chronic cough since 2012. ? ?TEST/EVENTS :  ?PFT 04/05/18 >> FEV1 1.49 (64%), FEV1% 71, TLC 6.07 (123%), RV 4.01 (195%), DLCO 79% ?  ?Chest Imaging:  ?HRCT chest 04/20/18 >> scarring in RML and Lingula, few scattered nodules up to 5 mm, diffuse bronchial wall thickening, fatty liver ? ?11/03/2021 Follow up : Chronic cough  ?Patient returns for 6-week follow-up.  Patient has a known history of chronic cough.  This has been present for years.  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  She has been tried on Breo in the past without any perceived benefit felt like it made her cough worse.  She has significant throat clearing that feels like it habit.  She does have some intermittent reflux and postnasal drainage.  Patient was set up for pulmonary function testing that shows moderate restriction with an FEV1 at 67%, ratio 72, FVC 71%, no significant bronchodilator response, DLCO at 95%..  Patient says she has been tried on several different medicines but has trouble taking any type of medicine due to essential tremor.  Every medicine seems to make her tremor worse.  Patient was set up for lab work including a CBC with differential, IgE they were all unrevealing.  Patient does sing in the choir at church.  Cough seems to be worse at night. ? ? ? ?Allergies  ?Allergen Reactions  ? Codeine Nausea Only  ? Penicillins Hives, Swelling and Other (See Comments)  ?  HIVES AND SWELLING OF THE THROAT ?Has patient had a PCN reaction causing immediate rash, facial/tongue/throat swelling, SOB or lightheadedness with hypotension: Yes ?Has patient had a PCN reaction causing severe rash involving mucus membranes or skin necrosis: No ?Has patient had  a PCN reaction that required hospitalization: Yes- Urgent care ?Has patient had a PCN reaction occurring within the last 10 years: No ?If all of the above answers are "NO", then may proceed with Cephalosporin use. ?  ? Atorvastatin   ?  Patient unsure of accuracy   ? ? ?Immunization History  ?Administered Date(s) Administered  ? Fluad Quad(high Dose 65+) 05/24/2019, 05/18/2020, 05/25/2021  ? Moderna Sars-Covid-2 Vaccination 09/06/2019, 10/04/2019  ? ? ?Past Medical History:  ?Diagnosis Date  ? Aneurysm ([place]) 1997  ? no surgery  ? Anxiety   ? Benign familial tremor   ? GERD (gastroesophageal reflux disease)   ? Headache(784.0)   ? Hypercholesterolemia   ? Tremor   ? ? ?Tobacco History: ?Social History  ? ?Tobacco Use  ?Smoking Status Never  ?Smokeless Tobacco Never  ? ?Counseling given: Not Answered ? ? ?Outpatient Medications Prior to Visit  ?Medication Sig Dispense Refill  ? acetaminophen (TYLENOL) 500 MG tablet Take 500 mg by mouth every 6 (six) hours as needed for moderate pain or headache.     ? Calcium Carb-Cholecalciferol (CALCIUM 600/VITAMIN D3 PO) Take 1 tablet by mouth daily.    ? Cholecalciferol (VITAMIN D3) 2000 units TABS Take 2,000 Units by mouth daily.     ? famotidine (PEPCID) 20 MG tablet One after supper 30 tablet 11  ? pantoprazole (PROTONIX) 40 MG tablet Take 1 tablet (40 mg total) by mouth daily. Take 30-60 min before  first meal of the day 30 tablet 2  ? propranolol (INDERAL) 10 MG tablet Take 10 mg by mouth daily. May take up to '30mg'$  daily as needed    ? simvastatin (ZOCOR) 10 MG tablet Take 10 mg by mouth every evening.     ? ?No facility-administered medications prior to visit.  ? ? ? ?Review of Systems:  ? ?Constitutional:   No  weight loss, night sweats,  Fevers, chills, fatigue, or  lassitude. ? ?HEENT:   No headaches,  Difficulty swallowing,  Tooth/dental problems, or  Sore throat,  ?              No sneezing, itching, ear ache,  ?+nasal congestion, post nasal drip,  ? ?CV:  No chest pain,   Orthopnea, PND, swelling in lower extremities, anasarca, dizziness, palpitations, syncope.  ? ?GI  No heartburn, indigestion, abdominal pain, nausea, vomiting, diarrhea, change in bowel habits, loss of appetite, bloody stools.  ? ?Resp:  No chest wall deformity ? ?Skin: no rash or lesions. ? ?GU: no dysuria, change in color of urine, no urgency or frequency.  No flank pain, no hematuria  ? ?MS:  No joint pain or swelling.  No decreased range of motion.  No back pain. ? ? ? ?Physical Exam ? ?BP 132/62 (BP Location: Left Arm, Cuff Size: Large)   Pulse 66   Temp 97.7 ?F (36.5 ?C) (Temporal)   Ht 5' 2.5" (1.588 m)   Wt 170 lb (77.1 kg)   SpO2 96%   BMI 30.60 kg/m?  ? ?GEN: A/Ox3; pleasant , NAD, well nourished  ?  ?HEENT:  [place]/AT,   NOSE-clear, THROAT-clear, no lesions, no postnasal drip or exudate noted.  ? ?NECK:  Supple w/ fair ROM; no JVD; normal carotid impulses w/o bruits; no thyromegaly or nodules palpated; no lymphadenopathy.   ? ?RESP  Clear  P & A; w/o, wheezes/ rales/ or rhonchi. no accessory muscle use, no dullness to percussion ? ?CARD:  RRR, no m/r/g, no peripheral edema, pulses intact, no cyanosis or clubbing. ? ?GI:   Soft & nt; nml bowel sounds; no organomegaly or masses detected.  ? ?Musco: Warm bil, no deformities or joint swelling noted.  ? ?Neuro: alert, no focal deficits noted.   ? ?Skin: Warm, no lesions or rashes ? ? ? ?Lab Results: ? ?CBC ?   ?Component Value Date/Time  ? WBC 11.6 (H) 09/27/2021 1137  ? WBC 13.9 (H) 02/21/2020 1128  ? RBC 4.92 09/27/2021 1137  ? RBC 4.68 02/21/2020 1128  ? HGB 14.2 09/27/2021 1137  ? HCT 43.7 09/27/2021 1137  ? PLT 410 09/27/2021 1137  ? MCV 89 09/27/2021 1137  ? MCH 28.9 09/27/2021 1137  ? MCH 29.5 02/21/2020 1128  ? MCHC 32.5 09/27/2021 1137  ? MCHC 31.2 02/21/2020 1128  ? RDW 12.2 09/27/2021 1137  ? LYMPHSABS 2.2 09/27/2021 1137  ? MONOABS 1.2 (H) 02/21/2020 1128  ? EOSABS 0.1 09/27/2021 1137  ? BASOSABS 0.1 09/27/2021 1137  ? ? ?BMET ?   ?Component  Value Date/Time  ? NA 145 (H) 09/27/2021 1137  ? K 5.2 09/27/2021 1137  ? CL 102 09/27/2021 1137  ? CO2 27 09/27/2021 1137  ? GLUCOSE 74 09/27/2021 1137  ? BUN 17 09/27/2021 1137  ? CREATININE 0.71 09/27/2021 1137  ? CALCIUM 10.3 09/27/2021 1137  ? ? ?BNP ?No results found for: BNP ? ?ProBNP ?No results found for: PROBNP ? ?Imaging: ?No results found. ? ? ? ? ?  Latest Ref Rng & Units 11/03/2021  ? 10:37 AM 04/05/2018  ?  9:52 AM  ?PFT Results  ?FVC-Pre L 1.91  P 2.11    ?FVC-Predicted Pre % 67  P 69    ?FVC-Post L 2.03  P   ?FVC-Predicted Post % 71  P   ?Pre FEV1/FVC % % 75  P 71    ?Post FEV1/FCV % % 72  P   ?FEV1-Pre L 1.43  P 1.49    ?FEV1-Predicted Pre % 66  P 64    ?FEV1-Post L 1.46  P   ?DLCO uncorrected ml/min/mmHg 17.67  P 18.15    ?DLCO UNC% % 95  P 79    ?DLCO corrected ml/min/mmHg 17.67  P   ?DLCO COR %Predicted % 95  P   ?DLVA Predicted % 125  P 105    ?TLC L 5.27  P 6.07    ?TLC % Predicted % 109  P 123    ?RV % Predicted % 154  P 195    ?  ?P Preliminary result  ? ? ?No results found for: NITRICOXIDE ? ? ? ? ? ?Assessment & Plan:  ? ?No problem-specific Assessment & Plan notes found for this encounter. ? ? ? ? ?Rexene Edison, NP ?11/03/2021 ? ?

## 2021-11-03 NOTE — Patient Instructions (Signed)
Full PFT performed today. °

## 2021-11-03 NOTE — Patient Instructions (Addendum)
Voice rest.  ?Sips of water to soothe throat, may use honey to help cough  ?Delsym childrens - 1 tsp Twice daily  for cough as needed.  ?Referral ENT for chronic cough . (Dr. Benjamine Mola)  ?Tessalon Three times a day  As needed  cough  ?Pepcid '20mg'$  At bedtime   ?Flovent 44 1 puff Twice daily, rinse after use .  ?Follow up with Dr. Halford Chessman  in 2 months and As needed   ?Please contact office for sooner follow up if symptoms do not improve or worsen or seek emergency care  ? ?

## 2021-11-04 ENCOUNTER — Encounter: Payer: Self-pay | Admitting: Adult Health

## 2021-11-04 DIAGNOSIS — K219 Gastro-esophageal reflux disease without esophagitis: Secondary | ICD-10-CM | POA: Insufficient documentation

## 2021-11-04 NOTE — Assessment & Plan Note (Signed)
GERD diet can add in Pepcid at bedtime ?

## 2021-11-04 NOTE — Assessment & Plan Note (Signed)
Upper airway cough syndrome since 2012.  Patient's had an extensive work-up.  Pulmonary function testing shows some moderate restriction.  May have a component of asthma. ?With her recurrent throat clearing, voice fatigue recommend a ENT referral.  Have asked her to stop singing in the choir and do voice rest. ?We will begin empiric ICS low-dose.  Cough control regimen.  Continue with trigger prevention ? ?Plan  ?Patient Instructions  ?Voice rest.  ?Sips of water to soothe throat, may use honey to help cough  ?Delsym childrens - 1 tsp Twice daily  for cough as needed.  ?Referral ENT for chronic cough . (Dr. Benjamine Mola)  ?Tessalon Three times a day  As needed  cough  ?Pepcid '20mg'$  At bedtime   ?Flovent 44 1 puff Twice daily, rinse after use .  ?Follow up with Dr. Halford Chessman  in 2 months and As needed   ?Please contact office for sooner follow up if symptoms do not improve or worsen or seek emergency care  ? ?  ? ?

## 2021-11-04 NOTE — Progress Notes (Signed)
Reviewed and agree with assessment/plan. ? ? ?Kaitlyn Mires, MD ?[place] ?11/04/2021, 2:35 PM ?Pager:  [phone] ? ?

## 2021-11-05 DIAGNOSIS — Z20822 Contact with and (suspected) exposure to covid-19: Secondary | ICD-10-CM | POA: Diagnosis not present

## 2021-11-09 DIAGNOSIS — R059 Cough, unspecified: Secondary | ICD-10-CM | POA: Diagnosis not present

## 2021-11-09 DIAGNOSIS — Z20822 Contact with and (suspected) exposure to covid-19: Secondary | ICD-10-CM | POA: Diagnosis not present

## 2021-11-09 DIAGNOSIS — R051 Acute cough: Secondary | ICD-10-CM | POA: Diagnosis not present

## 2021-11-15 DIAGNOSIS — Z20822 Contact with and (suspected) exposure to covid-19: Secondary | ICD-10-CM | POA: Diagnosis not present

## 2021-11-18 ENCOUNTER — Telehealth: Payer: Self-pay | Admitting: Adult Health

## 2021-11-19 NOTE — Telephone Encounter (Signed)
I will look for one in [place] and send the referral to them.  ?

## 2021-11-19 NOTE — Telephone Encounter (Signed)
Routing to [facility] to see if they are able to send this to ENT in [place] based off of the prior order that was placed or if they need a new order. ?

## 2021-11-23 NOTE — Telephone Encounter (Signed)
TP, can you please advise? Thanks!  

## 2021-11-23 NOTE — Telephone Encounter (Signed)
I've tried both the numbers for the places in [place] and I haven't been able to get a hold of anybody. Dr. Benjamine Mola is in [place] but he's not accepting new patients at this time. I had already sent the referral to [facility] because they are the only ones accepting new patients at this time, and I called the patient and explained that Dr. Benjamine Mola wasn't accepting new patients at this time and specifically asked if it was ok to send the referral to [facility] she said that was fine but she didn't really want to go to a ENT doctor and have them stick instruments down her throat. What do we need to do now? ?

## 2021-11-24 NOTE — Telephone Encounter (Signed)
Called and spoke with patient. She verbalized understanding. She stated that she had requested an ENT referral to [place] instead of [place] since [place] is closer. She found an ENT: [facility], phone: [phone].  ? ?She wants to give some more time to the recommendations and inhaler that TP gave her at the last OV.  ? ?She is aware to call us if anything changes.  ? ?Nothing further needed at time of call.  ?

## 2021-11-24 NOTE — Telephone Encounter (Signed)
That is fine if she does not want to go to ENT can reevaluate on return ov and discuss in more detail at follow up ov.  ? ?

## 2021-12-06 DIAGNOSIS — Z20822 Contact with and (suspected) exposure to covid-19: Secondary | ICD-10-CM | POA: Diagnosis not present

## 2022-01-10 ENCOUNTER — Encounter: Payer: Self-pay | Admitting: Pulmonary Disease

## 2022-01-10 ENCOUNTER — Ambulatory Visit (INDEPENDENT_AMBULATORY_CARE_PROVIDER_SITE_OTHER): Payer: Medicare Other | Admitting: Pulmonary Disease

## 2022-01-10 VITALS — BP 138/70 | HR 72 | Temp 97.9°F | Ht 62.0 in | Wt 170.4 lb

## 2022-01-10 DIAGNOSIS — J4530 Mild persistent asthma, uncomplicated: Secondary | ICD-10-CM

## 2022-01-10 DIAGNOSIS — R058 Other specified cough: Secondary | ICD-10-CM | POA: Diagnosis not present

## 2022-01-10 DIAGNOSIS — K219 Gastro-esophageal reflux disease without esophagitis: Secondary | ICD-10-CM | POA: Diagnosis not present

## 2022-01-10 NOTE — Progress Notes (Signed)
[facility]  Chief Complaint  Patient presents with   Follow-up    Patient is doing good, no concerns does feel like she is getting better.     Past Surgical History:  She  has a past surgical history that includes Colonoscopy (2006); Colonoscopy (02/25/2011); Eye surgery; Esophagogastroduodenoscopy endoscopy; Colonoscopy (N/A, 02/19/2018); Esophagogastroduodenoscopy (N/A, 02/19/2018); Savory dilation (N/A, 02/19/2018); biopsy (02/19/2018); and Breast biopsy (Right, 2005).  Past Medical History:  Anxiety, Tremor, GERD, Headache, HLD, PUD, Esophageal stricture s/p dilation  Constitutional:  BP 138/70 (BP Location: Left Arm, Patient Position: Sitting, Cuff Size: Normal)   Pulse 72   Temp 97.9 F (36.6 C) (Oral)   Ht '5\' 2"'$  (1.575 m)   Wt 170 lb 6.4 oz (77.3 kg)   SpO2 96%   BMI 31.17 kg/m   Brief Summary:  Kaitlyn Glass is a 71 y.o. female with chronic cough from post nasal drip, asthma, and reflux.      Subjective:   She saw Tammy Parrett in April.  Cough improved.  Still has some sneeze and post nasal drip.  Using flovent one puff bid works better.  Gets reflux symptoms if she skips evening dose of famotidine.  Doesn't think she needs ENT assessment since she is doing better.  Her PFT showed mild obstruction, air trapping, and mild diffusion defect.  Physical Exam:   Appearance - well kempt   ENMT - no sinus tenderness, no oral exudate, no LAN, Mallampati 3 airway, no stridor  Respiratory - equal breath sounds bilaterally, no wheezing or rales  CV - s1s2 regular rate and rhythm, no murmurs  Ext - no clubbing, no edema  Skin - no rashes  Psych - normal mood and affect    Pulmonary testing:  PFT 04/05/18 >> FEV1 1.49 (64%), FEV1% 71, TLC 6.07 (123%), RV 4.01 (195%), DLCO 79% IgE 09/27/21 >> 10 PFT 11/03/21 >> FEV1 1.46 (67%), FEV1% 72, TLC 5.27 (109%), RV 3.24 (154%), DLCO 95%  Chest Imaging:  HRCT chest 04/20/18 >>  scarring in RML and Lingula, few scattered nodules up to 5 mm, diffuse bronchial wall thickening, fatty liver  Social History:  She  reports that she has never smoked. She has never used smokeless tobacco. She reports that she does not drink alcohol and does not use drugs.  Family History:  Her family history includes Breast cancer in her sister; Colon cancer in her brother; Parkinson's disease in her sister; Throat cancer in her brother; Tremor in her brother.     Assessment/Plan:   Mild, persistent asthma. - intolerant of LABA's due to worsening tremor - continue flovent 44 mcg one puff bid  Upper airway cough syndrome with post nasal drip. - prn flonase  Laryngopharyngeal reflux. - continue famotidine 20 mg nightly - previously seen by Dr. Barney Drain with GI; she doesn't think she needs GI referral at this time  Essential tremor. - followed by Dr. Andrey Spearman with [facility]  Time Spent Involved in Patient Care on Day of Examination:  27 minutes  Follow up:   Patient Instructions  Follow up in 6 months  Medication List:   Allergies as of 01/10/2022       Reactions   Codeine Nausea Only   Penicillins Hives, Swelling, Other (See Comments)   HIVES AND SWELLING OF THE THROAT Has patient had a PCN reaction causing immediate rash, facial/tongue/throat swelling, SOB or lightheadedness with hypotension: Yes Has patient had a PCN reaction causing severe  rash involving mucus membranes or skin necrosis: No Has patient had a PCN reaction that required hospitalization: Yes- Urgent care Has patient had a PCN reaction occurring within the last 10 years: No If all of the above answers are "NO", then may proceed with Cephalosporin use.   Atorvastatin    Patient unsure of accuracy  Had reaction to Niacin         Medication List        Accurate as of January 10, 2022 11:07 AM. If you have any questions, ask your nurse or doctor.          STOP taking these  medications    benzonatate 200 MG capsule Commonly known as: TESSALON Stopped by: [name], MD   pantoprazole 40 MG tablet Commonly known as: Protonix Stopped by: [name], MD       TAKE these medications    acetaminophen 500 MG tablet Commonly known as: TYLENOL Take 500 mg by mouth every 6 (six) hours as needed for moderate pain or headache.   CALCIUM 600/VITAMIN D3 PO Take 1 tablet by mouth daily.   famotidine 20 MG tablet Commonly known as: Pepcid One after supper   fluticasone 44 MCG/ACT inhaler Commonly known as: Flovent HFA Inhale 1 puff into the lungs 2 (two) times daily.   propranolol 10 MG tablet Commonly known as: INDERAL Take 10 mg by mouth daily. May take up to '30mg'$  daily as needed   simvastatin 10 MG tablet Commonly known as: ZOCOR Take 10 mg by mouth every evening.   Vitamin D3 50 MCG (2000 UT) Tabs Take 2,000 Units by mouth daily.        Signature:  [name], MD [place] Pager - [phone] 01/10/2022, 11:07 AM

## 2022-01-10 NOTE — Patient Instructions (Signed)
Follow up in 6 months 

## 2022-01-18 ENCOUNTER — Encounter: Payer: Self-pay | Admitting: Orthopaedic Surgery

## 2022-01-18 ENCOUNTER — Ambulatory Visit (INDEPENDENT_AMBULATORY_CARE_PROVIDER_SITE_OTHER): Payer: Medicare Other

## 2022-01-18 ENCOUNTER — Ambulatory Visit (INDEPENDENT_AMBULATORY_CARE_PROVIDER_SITE_OTHER): Payer: Medicare Other | Admitting: Orthopaedic Surgery

## 2022-01-18 VITALS — BP 166/87 | HR 73 | Ht 62.0 in | Wt 169.1 lb

## 2022-01-18 DIAGNOSIS — M79641 Pain in right hand: Secondary | ICD-10-CM

## 2022-01-18 NOTE — Progress Notes (Signed)
Subjective:    Patient ID: Kaitlyn Glass, female    DOB: Dec 27, 1950, 71 y.o.   MRN: [id number]  HPI She has had pain in the right index finger over the last several months.  The finger is stiff at times, more in the morning.  Cold makes it worse.  She has no trauma, no redness. She has had some numbness at times of the hand but that is infrequent.  She uses Voltaren Gel and it helps.  She had aneurysm in the head and has been told not to take NSAIDs.  She has no swelling.   Review of Systems  Constitutional:  Positive for activity change.  Musculoskeletal:  Positive for arthralgias.  All other systems reviewed and are negative. For Review of Systems, all other systems reviewed and are negative.  The following is a summary of the past history medically, past history surgically, known current medicines, social history and family history.  This information is gathered electronically by the computer from prior information and documentation.  I review this each visit and have found including this information at this point in the chart is beneficial and informative.   Past Medical History:  Diagnosis Date   Aneurysm ([place]) 1997   no surgery   Anxiety    Benign familial tremor    GERD (gastroesophageal reflux disease)    Headache(784.0)    Hypercholesterolemia    Tremor     Past Surgical History:  Procedure Laterality Date   BIOPSY  02/19/2018   Procedure: BIOPSY;  Surgeon: Danie Binder, MD;  [facility];  Service: Endoscopy;;  gastric   BREAST BIOPSY Right 2005   negative for CA   COLONOSCOPY  2006   Family Hx Colon Cancer - Brother AGE > 48   COLONOSCOPY  02/25/2011   Dr. Oneida Alar: sessile hyperplastic rectal polyp, surveillance in 5-10 years   COLONOSCOPY N/A 02/19/2018   Procedure: COLONOSCOPY;  Surgeon: Danie Binder, MD;  Location: [facility];  Service: Endoscopy;  Laterality: N/A;  12:00pm   ESOPHAGOGASTRODUODENOSCOPY N/A 02/19/2018   Procedure:  ESOPHAGOGASTRODUODENOSCOPY (EGD);  Surgeon: Danie Binder, MD;  Location[facility];  Service: Endoscopy;  Laterality: N/A;   ESOPHAGOGASTRODUODENOSCOPY ENDOSCOPY     EYE SURGERY     SAVORY DILATION N/A 02/19/2018   Procedure: SAVORY DILATION;  Surgeon: Danie Binder, MD;  Location: [facility];  Service: Endoscopy;  Laterality: N/A;    Current Outpatient Medications on File Prior to Visit  Medication Sig Dispense Refill   acetaminophen (TYLENOL) 500 MG tablet Take 500 mg by mouth every 6 (six) hours as needed for moderate pain or headache.      Calcium Carb-Cholecalciferol (CALCIUM 600/VITAMIN D3 PO) Take 1 tablet by mouth daily.     Cholecalciferol (VITAMIN D3) 2000 units TABS Take 2,000 Units by mouth daily.      famotidine (PEPCID) 20 MG tablet One after supper 30 tablet 11   fluticasone (FLOVENT HFA) 44 MCG/ACT inhaler Inhale 1 puff into the lungs 2 (two) times daily. 1 each 5   propranolol (INDERAL) 10 MG tablet Take 10 mg by mouth daily. May take up to '30mg'$  daily as needed     simvastatin (ZOCOR) 10 MG tablet Take 10 mg by mouth every evening.      No current facility-administered medications on file prior to visit.    Social History   Socioeconomic History   Marital status: Married    Spouse name: [name]  Number of children: 2   Years of education: Not on file   Highest education level: Associate degree: academic program  Occupational History    Comment: retired  Tobacco Use   Smoking status: Never   Smokeless tobacco: Never  Vaping Use   Vaping Use: Never used  Substance and Sexual Activity   Alcohol use: No   Drug use: Never   Sexual activity: Yes    Birth control/protection: None  Other Topics Concern   Not on file  Social History Narrative   Lives with husband   [place]. 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 HOBBIES: CROCHETS, QUILT, READ. ONE KID: DAUGHTER(AGE 54)-LIVES IN  [place]. NO GRAND KIDS YET.   Social Determinants of Health   Financial Resource Strain: Not on file  Food Insecurity: Not on file  Transportation Needs: Not on file  Physical Activity: Not on file  Stress: Not on file  Social Connections: Not on file  Intimate Partner Violence: Not on file    Family History  Problem Relation Age of Onset   Breast cancer Sister    Parkinson's disease Sister        in 1 sister   Colon cancer Brother        age >  38 (diagnosed at age 17)   Tremor Brother    Throat cancer Brother    Colon polyps Neg Hx     BP (!) 166/87   Pulse 73   Ht '5\' 2"'$  (1.575 m)   Wt 169 lb 2 oz (76.7 kg)   BMI 30.93 kg/m   Body mass index is 30.93 kg/m.      Objective:   Physical Exam Vitals and nursing note reviewed. Exam conducted with a chaperone present.  Constitutional:      Appearance: She is well-developed.  HENT:     Head: Normocephalic and atraumatic.  Eyes:     Conjunctiva/sclera: Conjunctivae normal.     Pupils: Pupils are equal, round, and reactive to light.  Cardiovascular:     Rate and Rhythm: Normal rate and regular rhythm.  Pulmonary:     Effort: Pulmonary effort is normal.  Abdominal:     Palpations: Abdomen is soft.  Musculoskeletal:       Hands:     Cervical back: Normal range of motion and neck supple.  Skin:    General: Skin is warm and dry.  Neurological:     Mental Status: She is alert and oriented to person, place, and time.     Cranial Nerves: No cranial nerve deficit.     Motor: No abnormal muscle tone.     Coordination: Coordination normal.     Deep Tendon Reflexes: Reflexes are normal and symmetric. Reflexes normal.  Psychiatric:        Behavior: Behavior normal.        Thought Content: Thought content normal.        Judgment: Judgment normal.   X-rays were done of the right index finger, reported separately.  Negative.        Assessment & Plan:   Encounter Diagnosis  Name Primary?   Pain in right hand Yes    I have told her she may have early arthritis in the finger but I cannot completely state that.  Lab studies would be equivocal.    I told her to use the Voltaren Gel.  I told her to give it some  time and it will declare itself.  I will see her as needed.    Call if any problem.  Precautions discussed.  Electronically Signed Sanjuana Kava, MD 6/20/20238:44 AM

## 2022-01-19 ENCOUNTER — Telehealth: Payer: Self-pay | Admitting: Adult Health

## 2022-01-20 NOTE — Telephone Encounter (Signed)
ATC patient back in regards to the inhaler, but the phone line rang twice and the line went dead. Will attempt again

## 2022-01-21 MED ORDER — AEROCHAMBER MV MISC: 0 refills | Status: DC

## 2022-01-27 ENCOUNTER — Telehealth: Payer: Self-pay | Admitting: Adult Health

## 2022-01-27 NOTE — Telephone Encounter (Signed)
Attempted to call pt but unable to reach. Left message for her to return call. Due to  multiple attempts trying to reach pt and unable to do so, per protocol encounter will be closed. 

## 2022-01-28 NOTE — Telephone Encounter (Signed)
Called and spoke with pt letting her know the info about the Flovent inhaler that was found out from pharmacy and she verbalized understanding. Nothing further needed.

## 2022-02-15 DIAGNOSIS — H40013 Open angle with borderline findings, low risk, bilateral: Secondary | ICD-10-CM | POA: Diagnosis not present

## 2022-02-15 DIAGNOSIS — H35372 Puckering of macula, left eye: Secondary | ICD-10-CM | POA: Diagnosis not present

## 2022-02-15 DIAGNOSIS — H25812 Combined forms of age-related cataract, left eye: Secondary | ICD-10-CM | POA: Diagnosis not present

## 2022-02-15 DIAGNOSIS — H43812 Vitreous degeneration, left eye: Secondary | ICD-10-CM | POA: Diagnosis not present

## 2022-03-11 ENCOUNTER — Other Ambulatory Visit (HOSPITAL_COMMUNITY): Payer: Self-pay | Admitting: Family Medicine

## 2022-03-11 DIAGNOSIS — Z1231 Encounter for screening mammogram for malignant neoplasm of breast: Secondary | ICD-10-CM

## 2022-03-16 DIAGNOSIS — M179 Osteoarthritis of knee, unspecified: Secondary | ICD-10-CM | POA: Diagnosis not present

## 2022-03-16 DIAGNOSIS — E6609 Other obesity due to excess calories: Secondary | ICD-10-CM | POA: Diagnosis not present

## 2022-03-16 DIAGNOSIS — Z6829 Body mass index (BMI) 29.0-29.9, adult: Secondary | ICD-10-CM | POA: Diagnosis not present

## 2022-04-07 ENCOUNTER — Ambulatory Visit (HOSPITAL_COMMUNITY)
Admission: RE | Admit: 2022-04-07 | Discharge: 2022-04-07 | Disposition: A | Discharge status: Home / Self Care | Payer: Medicare Other | Source: Ambulatory Visit | Attending: Family Medicine | Admitting: Family Medicine

## 2022-04-07 DIAGNOSIS — Z1231 Encounter for screening mammogram for malignant neoplasm of breast: Secondary | ICD-10-CM | POA: Insufficient documentation

## 2022-04-20 DIAGNOSIS — H35351 Cystoid macular degeneration, right eye: Secondary | ICD-10-CM | POA: Diagnosis not present

## 2022-04-20 DIAGNOSIS — H25812 Combined forms of age-related cataract, left eye: Secondary | ICD-10-CM | POA: Diagnosis not present

## 2022-04-20 DIAGNOSIS — Z961 Presence of intraocular lens: Secondary | ICD-10-CM | POA: Diagnosis not present

## 2022-04-20 DIAGNOSIS — H43822 Vitreomacular adhesion, left eye: Secondary | ICD-10-CM | POA: Diagnosis not present

## 2022-04-20 DIAGNOSIS — H35372 Puckering of macula, left eye: Secondary | ICD-10-CM | POA: Diagnosis not present

## 2022-04-20 DIAGNOSIS — H43812 Vitreous degeneration, left eye: Secondary | ICD-10-CM | POA: Diagnosis not present

## 2022-04-20 DIAGNOSIS — H3581 Retinal edema: Secondary | ICD-10-CM | POA: Diagnosis not present

## 2022-05-11 DIAGNOSIS — J45909 Unspecified asthma, uncomplicated: Secondary | ICD-10-CM | POA: Diagnosis not present

## 2022-05-11 DIAGNOSIS — F419 Anxiety disorder, unspecified: Secondary | ICD-10-CM | POA: Diagnosis not present

## 2022-05-11 DIAGNOSIS — E119 Type 2 diabetes mellitus without complications: Secondary | ICD-10-CM | POA: Diagnosis not present

## 2022-05-11 DIAGNOSIS — Z1331 Encounter for screening for depression: Secondary | ICD-10-CM | POA: Diagnosis not present

## 2022-05-11 DIAGNOSIS — E6609 Other obesity due to excess calories: Secondary | ICD-10-CM | POA: Diagnosis not present

## 2022-05-11 DIAGNOSIS — E7849 Other hyperlipidemia: Secondary | ICD-10-CM | POA: Diagnosis not present

## 2022-05-11 DIAGNOSIS — R7309 Other abnormal glucose: Secondary | ICD-10-CM | POA: Diagnosis not present

## 2022-05-11 DIAGNOSIS — Z6830 Body mass index (BMI) 30.0-30.9, adult: Secondary | ICD-10-CM | POA: Diagnosis not present

## 2022-05-11 DIAGNOSIS — Z0000 Encounter for general adult medical examination without abnormal findings: Secondary | ICD-10-CM | POA: Diagnosis not present

## 2022-05-11 DIAGNOSIS — E559 Vitamin D deficiency, unspecified: Secondary | ICD-10-CM | POA: Diagnosis not present

## 2022-05-11 DIAGNOSIS — E782 Mixed hyperlipidemia: Secondary | ICD-10-CM | POA: Diagnosis not present

## 2022-05-11 DIAGNOSIS — R251 Tremor, unspecified: Secondary | ICD-10-CM | POA: Diagnosis not present

## 2022-05-12 DIAGNOSIS — Z23 Encounter for immunization: Secondary | ICD-10-CM | POA: Diagnosis not present

## 2022-06-15 DIAGNOSIS — D72829 Elevated white blood cell count, unspecified: Secondary | ICD-10-CM | POA: Diagnosis not present

## 2022-07-13 DIAGNOSIS — H3581 Retinal edema: Secondary | ICD-10-CM | POA: Diagnosis not present

## 2022-07-13 DIAGNOSIS — H35351 Cystoid macular degeneration, right eye: Secondary | ICD-10-CM | POA: Diagnosis not present

## 2022-07-13 DIAGNOSIS — H25812 Combined forms of age-related cataract, left eye: Secondary | ICD-10-CM | POA: Diagnosis not present

## 2022-07-13 DIAGNOSIS — H35372 Puckering of macula, left eye: Secondary | ICD-10-CM | POA: Diagnosis not present

## 2022-07-13 DIAGNOSIS — H43812 Vitreous degeneration, left eye: Secondary | ICD-10-CM | POA: Diagnosis not present

## 2022-07-15 ENCOUNTER — Inpatient Hospital Stay: Payer: Medicare Other

## 2022-07-15 ENCOUNTER — Encounter: Payer: Self-pay | Admitting: Hematology

## 2022-07-15 ENCOUNTER — Inpatient Hospital Stay: Payer: Medicare Other | Attending: Hematology | Admitting: Hematology

## 2022-07-15 VITALS — BP 136/69 | HR 76 | Temp 97.8°F | Resp 16 | Ht 62.0 in | Wt 171.2 lb

## 2022-07-15 DIAGNOSIS — Z800 Family history of malignant neoplasm of digestive organs: Secondary | ICD-10-CM | POA: Diagnosis not present

## 2022-07-15 DIAGNOSIS — D72828 Other elevated white blood cell count: Secondary | ICD-10-CM | POA: Insufficient documentation

## 2022-07-15 DIAGNOSIS — D72825 Bandemia: Secondary | ICD-10-CM | POA: Diagnosis not present

## 2022-07-15 DIAGNOSIS — Z803 Family history of malignant neoplasm of breast: Secondary | ICD-10-CM | POA: Diagnosis not present

## 2022-07-15 DIAGNOSIS — M25522 Pain in left elbow: Secondary | ICD-10-CM | POA: Insufficient documentation

## 2022-07-15 DIAGNOSIS — M25572 Pain in left ankle and joints of left foot: Secondary | ICD-10-CM | POA: Insufficient documentation

## 2022-07-15 DIAGNOSIS — D72829 Elevated white blood cell count, unspecified: Secondary | ICD-10-CM | POA: Diagnosis not present

## 2022-07-15 DIAGNOSIS — M25561 Pain in right knee: Secondary | ICD-10-CM | POA: Insufficient documentation

## 2022-07-15 LAB — URIC ACID: Uric Acid, Serum: 3.5 mg/dL (ref 2.5–7.1)

## 2022-07-15 LAB — CBC WITH DIFFERENTIAL/PLATELET
Abs Immature Granulocytes: 0.03 10*3/uL (ref 0.00–0.07)
Basophils Absolute: 0.1 10*3/uL (ref 0.0–0.1)
Basophils Relative: 1 %
Eosinophils Absolute: 0.1 10*3/uL (ref 0.0–0.5)
Eosinophils Relative: 1 %
HCT: 40.9 % (ref 36.0–46.0)
Hemoglobin: 12.8 g/dL (ref 12.0–15.0)
Immature Granulocytes: 0 %
Lymphocytes Relative: 14 %
Lymphs Abs: 1.6 10*3/uL (ref 0.7–4.0)
MCH: 28.6 pg (ref 26.0–34.0)
MCHC: 31.3 g/dL (ref 30.0–36.0)
MCV: 91.5 fL (ref 80.0–100.0)
Monocytes Absolute: 1 10*3/uL (ref 0.1–1.0)
Monocytes Relative: 9 %
Neutro Abs: 8.5 10*3/uL — ABNORMAL HIGH (ref 1.7–7.7)
Neutrophils Relative %: 75 %
Platelets: 387 10*3/uL (ref 150–400)
RBC: 4.47 MIL/uL (ref 3.87–5.11)
RDW: 13.4 % (ref 11.5–15.5)
WBC: 11.3 10*3/uL — ABNORMAL HIGH (ref 4.0–10.5)
nRBC: 0 % (ref 0.0–0.2)

## 2022-07-15 LAB — LACTATE DEHYDROGENASE: LDH: 116 U/L (ref 98–192)

## 2022-07-15 LAB — SEDIMENTATION RATE: Sed Rate: 38 mm/hr — ABNORMAL HIGH (ref 0–22)

## 2022-07-15 LAB — C-REACTIVE PROTEIN: CRP: 1.3 mg/dL — ABNORMAL HIGH (ref <1.0)

## 2022-07-15 NOTE — Patient Instructions (Signed)
[facility] [facility]  Discharge Instructions  You were seen today by Dr. Delton Coombes.  He discussed your personal history, family history and the events that led to your visit today.  He wants to do some additional labs today.  We will see you back in 3 weeks to review the results.     Thank you for choosing [facility] to provide your oncology and hematology care.   To afford each patient quality time with our provider, please arrive at least 15 minutes before your scheduled appointment time. You may need to reschedule your appointment if you arrive late (10 or more minutes). Arriving late affects you and other patients whose appointments are after yours.  Also, if you miss three or more appointments without notifying the office, you may be dismissed from the clinic at the provider's discretion.    Again, thank you for choosing [facility].  Our hope is that these requests will decrease the amount of time that you wait before being seen by our physicians.   If you have a lab appointment with the [facility] please come in thru the Main Entrance and check in at the main information desk.           _____________________________________________________________  Should you have questions after your visit to [facility], please contact our office at [phone] and follow the prompts.  Our office hours are 8:00 a.m. to 4:30 p.m. Monday - Thursday and 8:00 a.m. to 2:30 p.m. Friday.  Please note that voicemails left after 4:00 p.m. may not be returned until the following business day.  We are closed weekends and all major holidays.  You do have access to a nurse 24-7, just call the main number to the clinic [phone] and do not press any options, hold on the line and a nurse will answer the phone.    For prescription refill requests, have your pharmacy contact our office and allow 72 hours.    Masks are optional in the  cancer centers. If you would like for your care team to wear a mask while they are taking care of you, please let them know. You may have one support person who is at least 69 years old accompany you for your appointments.

## 2022-07-15 NOTE — Progress Notes (Signed)
CONSULT NOTE  Patient Care Team: Sharilyn Sites, MD as PCP - General (Family Medicine) Danie Binder, MD (Inactive) as Consulting Physician (Gastroenterology)  CHIEF COMPLAINTS/PURPOSE OF CONSULTATION:  Neutrophilic leukocytosis.  HISTORY OF PRESENTING ILLNESS:  Kaitlyn Glass 71 y.o. female is seen at the request of Dr. [name] for elevated white count.  CBC on 06/16/2022 showed white count 11.3, with absolute neutrophil count of 8.4.  Hemoglobin was normal.  Platelet count mildly elevated at 480.  CBC on 05/12/2022 with white count 11.2, ANC of 8.6 with normal hemoglobin and platelet count.  She denies any fevers, night sweats or weight loss.  Denies any recurrent infections.  No prior history of splenectomy.  She reports on and off pains in her left elbow, right knee and left ankle since she had initial COVID vaccines.  She lives at home with her husband and is independent of ADLs and IADLs.  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  Non-smoker.  MEDICAL HISTORY:  Past Medical History:  Diagnosis Date   Aneurysm ([place]) 1997   no surgery   Anxiety    Benign familial tremor    GERD (gastroesophageal reflux disease)    Headache(784.0)    Hypercholesterolemia    Tremor     SURGICAL HISTORY: Past Surgical History:  Procedure Laterality Date   BIOPSY  02/19/2018   Procedure: BIOPSY;  Surgeon: Danie Binder, MD;  [facility];  Service: Endoscopy;;  gastric   BREAST BIOPSY Right 2005   negative for CA   COLONOSCOPY  2006   Family Hx Colon Cancer - Brother AGE > 6   COLONOSCOPY  02/25/2011   Dr. Oneida Alar: sessile hyperplastic rectal polyp, surveillance in 5-10 years   COLONOSCOPY N/A 02/19/2018   Procedure: COLONOSCOPY;  Surgeon: Danie Binder, MD;  [facility];  Service: Endoscopy;  Laterality: N/A;  12:00pm   ESOPHAGOGASTRODUODENOSCOPY N/A 02/19/2018   Procedure: ESOPHAGOGASTRODUODENOSCOPY (EGD);  Surgeon: Danie Binder, MD;  [facility];  Service: Endoscopy;  Laterality: N/A;   ESOPHAGOGASTRODUODENOSCOPY ENDOSCOPY     EYE SURGERY     SAVORY DILATION N/A 02/19/2018   Procedure: SAVORY DILATION;  Surgeon: Danie Binder, MD;  [facility];  Service: Endoscopy;  Laterality: N/A;    SOCIAL HISTORY: Social History   Socioeconomic History   Marital status: Married    Spouse name: [name]   Number of children: 2   Years of education: Not on file   Highest education level: Associate degree: academic program  Occupational History    Comment: retired  Tobacco Use   Smoking status: Never   Smokeless tobacco: Never  Vaping Use   Vaping Use: Never used  Substance and Sexual Activity   Alcohol use: No   Drug use: Never   Sexual activity: Yes    Birth control/protection: None  Other Topics Concern   Not on file  Social History Narrative   Lives with husband   [place]. 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 HOBBIES: CROCHETS, QUILT, READ. ONE KID: DAUGHTER(AGE 7)-LIVES IN [place]. NO GRAND KIDS YET.   Social Determinants of Health   Financial Resource Strain: Not on file  Food Insecurity: No Food Insecurity (07/15/2022)   Hunger Vital Sign    Worried About Running Out of Food in the Last Year: Never true    Ran Out of Food  in the Last Year: Never true  Transportation Needs: No Transportation Needs (07/15/2022)   PRAPARE - Hydrologist (Medical): No    Lack of Transportation (Non-Medical): No  Physical Activity: Not on file  Stress: Not on file  Social Connections: Not on file  Intimate Partner Violence: Not At Risk (07/15/2022)   Humiliation, Afraid, Rape, and Kick questionnaire    Fear of Current or Ex-Partner: No    Emotionally Abused: No    Physically Abused: No    Sexually Abused: No    FAMILY HISTORY: Family History  Problem Relation Age of Onset    Breast cancer Sister    Parkinson's disease Sister        in 1 sister   Colon cancer Brother        age >  54 (diagnosed at age 29)   Tremor Brother    Throat cancer Brother    Colon polyps Neg Hx     ALLERGIES:  is allergic to codeine, penicillins, and atorvastatin.  MEDICATIONS:  Current Outpatient Medications  Medication Sig Dispense Refill   acetaminophen (TYLENOL) 500 MG tablet Take 500 mg by mouth every 6 (six) hours as needed for moderate pain or headache.      Calcium Carb-Cholecalciferol (CALCIUM 600/VITAMIN D3 PO) Take 1 tablet by mouth daily.     Cholecalciferol (VITAMIN D3) 2000 units TABS Take 2,000 Units by mouth daily.      famotidine (PEPCID) 20 MG tablet One after supper 30 tablet 11   fluticasone (FLOVENT HFA) 44 MCG/ACT inhaler Inhale 1 puff into the lungs 2 (two) times daily. 1 each 5   propranolol (INDERAL) 10 MG tablet Take 10 mg by mouth daily. May take up to 51m daily as needed     simvastatin (ZOCOR) 10 MG tablet Take 10 mg by mouth every evening.      Spacer/Aero-Holding Chambers (AEROCHAMBER MV) inhaler Use as instructed 1 each 0   No current facility-administered medications for this visit.    REVIEW OF SYSTEMS:   Constitutional: Denies fevers, chills or abnormal night sweats Eyes: Denies blurriness of vision, double vision or watery eyes Ears, nose, mouth, throat, and face: Denies mucositis or sore throat Respiratory: Denies cough, dyspnea or wheezes Cardiovascular: Denies palpitation, chest discomfort or lower extremity swelling Gastrointestinal:  Denies nausea, heartburn or change in bowel habits Skin: Denies abnormal skin rashes Lymphatics: Denies new lymphadenopathy or easy bruising Neurological:Denies numbness, tingling or new weaknesses Behavioral/Psych: Mood is stable, no new changes  All other systems were reviewed with the patient and are negative.  PHYSICAL EXAMINATION: ECOG PERFORMANCE STATUS: 1 - Symptomatic but completely  ambulatory  Vitals:   07/15/22 1007  BP: 136/69  Pulse: 76  Resp: 16  Temp: 97.8 F (36.6 C)  SpO2: 98%   Filed Weights   07/15/22 1007  Weight: 171 lb 3.2 oz (77.7 kg)    GENERAL:alert, no distress and comfortable SKIN: skin color, texture, turgor are normal, no rashes or significant lesions EYES: normal, conjunctiva are pink and non-injected, sclera clear OROPHARYNX:no exudate, no erythema and lips, buccal mucosa, and tongue normal  NECK: supple, thyroid normal size, non-tender, without nodularity LYMPH:  no palpable lymphadenopathy in the cervical, axillary or inguinal LUNGS: clear to auscultation and percussion with normal breathing effort HEART: regular rate & rhythm and no murmurs and no lower extremity edema ABDOMEN:abdomen soft, non-tender and normal bowel sounds Musculoskeletal:no cyanosis of digits and no clubbing  PSYCH: alert & oriented x  3 with fluent speech NEURO: no focal motor/sensory deficits  LABORATORY DATA:  I have reviewed the data as listed Recent Results (from the past 2160 hour(s))  Uric acid     Status: None   Collection Time: 07/15/22 10:45 AM  Result Value Ref Range   Uric Acid, Serum 3.5 2.5 - 7.1 mg/dL    Comment: Performed at [facility], [address], [place], [place] [address]  Sedimentation rate     Status: Abnormal   Collection Time: 07/15/22 10:45 AM  Result Value Ref Range   Sed Rate 38 (H) 0 - 22 mm/hr    Comment: Performed at [facility], [address] [place], [place] [address]  Lactate dehydrogenase     Status: None   Collection Time: 07/15/22 10:45 AM  Result Value Ref Range   LDH 116 98 - 192 U/L    Comment: Performed at [facility], [address] [place], [place] [address]  CBC with Differential/Platelet     Status: Abnormal   Collection Time: 07/15/22 10:45 AM  Result Value Ref Range   WBC 11.3 (H) 4.0 - 10.5 K/uL   RBC 4.47 3.87 - 5.11 MIL/uL   Hemoglobin 12.8 12.0 - 15.0 g/dL   HCT 40.9 36.0 - 46.0 %   MCV 91.5  80.0 - 100.0 fL   MCH 28.6 26.0 - 34.0 pg   MCHC 31.3 30.0 - 36.0 g/dL   RDW 13.4 11.5 - 15.5 %   Platelets 387 150 - 400 K/uL   nRBC 0.0 0.0 - 0.2 %   Neutrophils Relative % 75 %   Neutro Abs 8.5 (H) 1.7 - 7.7 K/uL   Lymphocytes Relative 14 %   Lymphs Abs 1.6 0.7 - 4.0 K/uL   Monocytes Relative 9 %   Monocytes Absolute 1.0 0.1 - 1.0 K/uL   Eosinophils Relative 1 %   Eosinophils Absolute 0.1 0.0 - 0.5 K/uL   Basophils Relative 1 %   Basophils Absolute 0.1 0.0 - 0.1 K/uL   Immature Granulocytes 0 %   Abs Immature Granulocytes 0.03 0.00 - 0.07 K/uL    Comment: Performed at [facility], [address], [place],  [address]    RADIOGRAPHIC STUDIES: I have personally reviewed the radiological images as listed and agreed with the findings in the report. No results found.  ASSESSMENT:  1.  Neutrophilic leukocytosis: - Patient seen at the request of Dr. [name] - CBC (06/16/2022): WBC-11.3, Hb-13.3, PLT-480, ANC-8.4 - CBC (05/12/2022): WBC-11.2, Hb-13.4, PLT-395, ANC-8.6 - Review of labs on epic shows white count mildly elevated between 11.6-13.9 from July 2021 - She reports pain in her left elbow, right knee and left ankle since she had COVID vaccines.  These pains come and go. - No prior history of splenectomy.  No recurrent infections.  No B symptoms.  2.  Social/family history: - She lives at home with her husband.  She is independent of ADLs and IADLs.  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  Non-smoker. - No family history of connective tissue disorders. - 1 brother had throat cancer.  Another brother had stomach cancer.  Maternal uncle had throat cancer.  PLAN:  1.  Neutrophilic leukocytosis: - I have discussed differential diagnosis of neutrophilic leukocytosis. - Will repeat CBC today and check for ESR, CRP, ANA and rheumatoid factor. - Will check for JAK2 V617F and BCR/ABL to evaluate for myeloproliferative neoplasms. - RTC  3 weeks for follow-up.   All questions were  answered. The patient knows to call the clinic with any problems, questions or concerns.     Derek Jack, MD 07/15/22 2:01 PM

## 2022-07-18 LAB — ANTINUCLEAR ANTIBODIES, IFA: ANA Ab, IFA: NEGATIVE

## 2022-07-20 LAB — BCR-ABL1 FISH
Cells Analyzed: 200
Cells Counted: 200

## 2022-07-22 LAB — JAK2 V617F RFX CALR/MPL/E12-15

## 2022-07-22 LAB — CALR +MPL + E12-E15  (REFLEX)

## 2022-08-16 NOTE — Progress Notes (Signed)
[place], [place] [address]   CLINIC:  Medical Oncology/Hematology  PCP:  Sharilyn Sites, [address] [place] [address] [phone]   REASON FOR VISIT:  Follow-up for leukocytosis  PRIOR THERAPY: None  CURRENT THERAPY: Under workup  INTERVAL HISTORY:   Ms. Kaitlyn Glass 72 y.o. female returns for routine follow-up of leukocytosis.  She was seen for initial consultation by Dr. Delton Coombes on 07/15/2022.  At today's visit, she reports feeling well.  She denies any changes in her baseline health status or symptoms since her initial visit with Dr. Delton Coombes last month.  She continues to deny any fevers, night sweats, or weight loss.  She denies any recurrent infections.  No prior history of splenectomy.  Denies any known history of autoimmune/inflammatory disease, but does report on and off pain in her left elbow, right knee, and left ankle since she had initial COVID vaccines.  She reports relief of joint pain with occasional prednisone and steroid shots by her PCP, most recently in August 2023.  She is a lifelong non-smoker.  She denies any new lumps or bumps.  She has 100% energy and 100% appetite. She endorses that she is maintaining a stable weight.  ASSESSMENT & PLAN:  1.  Neutrophilic leukocytosis - Seen at the request of Dr. [name] - Review of labs on Epic shows WBC mildly elevated between 11.6-13.9 from July 2021 - No known history of autoimmune or inflammatory disease, but she does report intermittent pain in left elbow, right knee, and left ankle since she had COVID vaccines. - No prior history of splenectomy.  No recurrent infections.  No B symptoms. - Occasional oral steroids and/or steroid injection for joint pain - Lifelong non-smoker  - Hematology workup (07/15/2022): Negative BCR ABL FISH Negative JAK2 with reflex to CALR and MPL Normal ANA.  Mildly elevated CRP 1.3, elevated ESR 38.  Uric acid normal. - Most recent  CBC (07/15/2022): WBC 11.3/ANC 8.5, otherwise normal CBC. - No palpable lymphadenopathy or splenomegaly on exam today  - PLAN: Differential diagnosis favors reactive leukocytosis in the setting of suspected occult inflammation. - Will check rheumatoid factor  - Labs only (CBC/D+ rheumatoid factor) in 3 months - Same-day labs (CBC/D, LDH, ESR, CRP) + OFFICE visit in 6 months  2.  Social/family history: -Other PMH includes anxiety, benign tremor, GERD, headache, hyperlipidemia, and anxiety - She lives at home with her husband.  She is independent of ADLs and IADLs.  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  Non-smoker. - No family history of connective tissue disorders. - One brother had throat cancer.  Another brother had stomach cancer.  Maternal uncle had throat cancer.  PLAN SUMMARY: >> Labs TODAY (rheumatoid factor) >> Labs only (CBC/D) in 3 months >> Labs in 6 months (CBC/D, LDH, ESR, CRP)  >> PHONE visit 1 week after labs    REVIEW OF SYSTEMS:   Review of Systems  Constitutional:  Negative for appetite change, chills, diaphoresis, fatigue, fever and unexpected weight change.  HENT:   Negative for lump/mass and nosebleeds.   Eyes:  Negative for eye problems.  Respiratory:  Negative for cough, hemoptysis and shortness of breath.   Cardiovascular:  Negative for chest pain, leg swelling and palpitations.  Gastrointestinal:  Negative for abdominal pain, blood in stool, constipation, diarrhea, nausea and vomiting.  Genitourinary:  Negative for hematuria.   Musculoskeletal:  Positive for arthralgias.  Skin: Negative.   Neurological:  Negative  for dizziness, headaches and light-headedness.  Hematological:  Does not bruise/bleed easily.     PHYSICAL EXAM:  ECOG PERFORMANCE STATUS: 0 - Asymptomatic  There were no vitals filed for this visit. There were no vitals filed for this visit. Physical Exam Constitutional:      Appearance: Normal  appearance. She is obese.  HENT:     Head: Normocephalic and atraumatic.     Mouth/Throat:     Mouth: Mucous membranes are moist.  Eyes:     Extraocular Movements: Extraocular movements intact.     Pupils: Pupils are equal, round, and reactive to light.  Cardiovascular:     Rate and Rhythm: Normal rate and regular rhythm.     Pulses: Normal pulses.     Heart sounds: Normal heart sounds.  Pulmonary:     Effort: Pulmonary effort is normal.     Breath sounds: Normal breath sounds.  Abdominal:     General: Bowel sounds are normal.     Palpations: Abdomen is soft.     Tenderness: There is no abdominal tenderness.  Musculoskeletal:        General: No swelling.     Right lower leg: No edema.     Left lower leg: No edema.  Lymphadenopathy:     Cervical: No cervical adenopathy.  Skin:    General: Skin is warm and dry.  Neurological:     General: No focal deficit present.     Mental Status: She is alert and oriented to person, place, and time.  Psychiatric:        Mood and Affect: Mood normal.        Behavior: Behavior normal.     PAST MEDICAL/SURGICAL HISTORY:  Past Medical History:  Diagnosis Date   Aneurysm ([place]) 1997   no surgery   Anxiety    Benign familial tremor    GERD (gastroesophageal reflux disease)    Headache(784.0)    Hypercholesterolemia    Tremor    Past Surgical History:  Procedure Laterality Date   BIOPSY  02/19/2018   Procedure: BIOPSY;  Surgeon: Danie Binder, MD;  [facility];  Service: Endoscopy;;  gastric   BREAST BIOPSY Right 2005   negative for CA   COLONOSCOPY  2006   Family Hx Colon Cancer - Brother AGE > 22   COLONOSCOPY  02/25/2011   Dr. Oneida Alar: sessile hyperplastic rectal polyp, surveillance in 5-10 years   COLONOSCOPY N/A 02/19/2018   Procedure: COLONOSCOPY;  Surgeon: Danie Binder, MD;  [facility];  Service: Endoscopy;  Laterality: N/A;  12:00pm   ESOPHAGOGASTRODUODENOSCOPY N/A 02/19/2018   Procedure:  ESOPHAGOGASTRODUODENOSCOPY (EGD);  Surgeon: Danie Binder, MD;  [facility];  Service: Endoscopy;  Laterality: N/A;   ESOPHAGOGASTRODUODENOSCOPY ENDOSCOPY     EYE SURGERY     SAVORY DILATION N/A 02/19/2018   Procedure: SAVORY DILATION;  Surgeon: Danie Binder, MD;  [facility];  Service: Endoscopy;  Laterality: N/A;    SOCIAL HISTORY:  Social History   Socioeconomic History   Marital status: Married    Spouse name: [name]   Number of children: 2   Years of education: Not on file   Highest education level: Associate degree: academic program  Occupational History    Comment: retired  Tobacco Use   Smoking status: Never   Smokeless tobacco: Never  Vaping Use   Vaping Use: Never used  Substance and Sexual Activity   Alcohol use: No  Drug use: Never   Sexual activity: Yes    Birth control/protection: None  Other Topics Concern   Not on file  Social History Narrative   Lives with husband   [place]. 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 HOBBIES: CROCHETS, QUILT, READ. ONE KID: DAUGHTER(AGE 45)-LIVES IN [place]. NO GRAND KIDS YET.   Social Determinants of Health   Financial Resource Strain: Not on file  Food Insecurity: No Food Insecurity (07/15/2022)   Hunger Vital Sign    Worried About Running Out of Food in the Last Year: Never true    Ran Out of Food in the Last Year: Never true  Transportation Needs: No Transportation Needs (07/15/2022)   PRAPARE - Hydrologist (Medical): No    Lack of Transportation (Non-Medical): No  Physical Activity: Not on file  Stress: Not on file  Social Connections: Not on file  Intimate Partner Violence: Not At Risk (07/15/2022)   Humiliation, Afraid, Rape, and Kick questionnaire    Fear of Current or Ex-Partner: No    Emotionally Abused: No    Physically Abused: No    Sexually Abused: No    FAMILY HISTORY:   Family History  Problem Relation Age of Onset   Breast cancer Sister    Parkinson's disease Sister        in 1 sister   Colon cancer Brother        age >  40 (diagnosed at age 60)   Tremor Brother    Throat cancer Brother    Colon polyps Neg Hx     CURRENT MEDICATIONS:  Outpatient Encounter Medications as of 08/17/2022  Medication Sig   acetaminophen (TYLENOL) 500 MG tablet Take 500 mg by mouth every 6 (six) hours as needed for moderate pain or headache.    Calcium Carb-Cholecalciferol (CALCIUM 600/VITAMIN D3 PO) Take 1 tablet by mouth daily.   Cholecalciferol (VITAMIN D3) 2000 units TABS Take 2,000 Units by mouth daily.    famotidine (PEPCID) 20 MG tablet One after supper   fluticasone (FLOVENT HFA) 44 MCG/ACT inhaler Inhale 1 puff into the lungs 2 (two) times daily.   propranolol (INDERAL) 10 MG tablet Take 10 mg by mouth daily. May take up to '30mg'$  daily as needed   simvastatin (ZOCOR) 10 MG tablet Take 10 mg by mouth every evening.    Spacer/Aero-Holding Chambers (AEROCHAMBER MV) inhaler Use as instructed   No facility-administered encounter medications on file as of 08/17/2022.    ALLERGIES:  Allergies  Allergen Reactions   Codeine Nausea Only   Penicillins Hives, Swelling and Other (See Comments)    HIVES AND SWELLING OF THE THROAT Has patient had a PCN reaction causing immediate rash, facial/tongue/throat swelling, SOB or lightheadedness with hypotension: Yes Has patient had a PCN reaction causing severe rash involving mucus membranes or skin necrosis: No Has patient had a PCN reaction that required hospitalization: Yes- Urgent care Has patient had a PCN reaction occurring within the last 10 years: No If all of the above answers are "NO", then may proceed with Cephalosporin use.    Atorvastatin     Patient unsure of accuracy  Had reaction to Niacin     LABORATORY DATA:  I have reviewed the labs as listed.  CBC    Component Value Date/Time   WBC 11.3 (H)  07/15/2022 1045   RBC 4.47 07/15/2022 1045   HGB 12.8 07/15/2022  1045   HGB 14.2 09/27/2021 1137   HCT 40.9 07/15/2022 1045   HCT 43.7 09/27/2021 1137   PLT 387 07/15/2022 1045   PLT 410 09/27/2021 1137   MCV 91.5 07/15/2022 1045   MCV 89 09/27/2021 1137   MCH 28.6 07/15/2022 1045   MCHC 31.3 07/15/2022 1045   RDW 13.4 07/15/2022 1045   RDW 12.2 09/27/2021 1137   LYMPHSABS 1.6 07/15/2022 1045   LYMPHSABS 2.2 09/27/2021 1137   MONOABS 1.0 07/15/2022 1045   EOSABS 0.1 07/15/2022 1045   EOSABS 0.1 09/27/2021 1137   BASOSABS 0.1 07/15/2022 1045   BASOSABS 0.1 09/27/2021 1137      Latest Ref Rng & Units 09/27/2021   11:37 AM  CMP  Glucose 70 - 99 mg/dL 74   BUN 8 - 27 mg/dL 17   Creatinine 0.57 - 1.00 mg/dL 0.71   Sodium 134 - 144 mmol/L 145   Potassium 3.5 - 5.2 mmol/L 5.2   Chloride 96 - 106 mmol/L 102   CO2 20 - 29 mmol/L 27   Calcium 8.7 - 10.3 mg/dL 10.3   Total Protein 6.0 - 8.5 g/dL 6.9   Total Bilirubin 0.0 - 1.2 mg/dL 0.3   Alkaline Phos 44 - 121 IU/L 104   AST 0 - 40 IU/L 15   ALT 0 - 32 IU/L 14     DIAGNOSTIC IMAGING:  I have independently reviewed the relevant imaging and discussed with the patient.   WRAP UP:  All questions were answered. The patient knows to call the clinic with any problems, questions or concerns.  Medical decision making: Low  Time spent on visit: I spent 20 minutes counseling the patient face to face. The total time spent in the appointment was 30 minutes and more than 50% was on counseling.  Harriett Rush, PA-C  08/17/2022 9:13 AM

## 2022-08-17 ENCOUNTER — Inpatient Hospital Stay: Payer: Medicare Other

## 2022-08-17 ENCOUNTER — Inpatient Hospital Stay: Payer: Medicare Other | Attending: Hematology | Admitting: Physician Assistant

## 2022-08-17 ENCOUNTER — Other Ambulatory Visit: Payer: Self-pay

## 2022-08-17 ENCOUNTER — Encounter: Payer: Self-pay | Admitting: Physician Assistant

## 2022-08-17 VITALS — BP 154/80 | HR 80 | Temp 98.4°F | Resp 18 | Wt 171.6 lb

## 2022-08-17 DIAGNOSIS — M25572 Pain in left ankle and joints of left foot: Secondary | ICD-10-CM | POA: Diagnosis not present

## 2022-08-17 DIAGNOSIS — M25522 Pain in left elbow: Secondary | ICD-10-CM | POA: Diagnosis not present

## 2022-08-17 DIAGNOSIS — D72825 Bandemia: Secondary | ICD-10-CM

## 2022-08-17 DIAGNOSIS — Z800 Family history of malignant neoplasm of digestive organs: Secondary | ICD-10-CM | POA: Insufficient documentation

## 2022-08-17 DIAGNOSIS — M25561 Pain in right knee: Secondary | ICD-10-CM | POA: Diagnosis not present

## 2022-08-17 DIAGNOSIS — D72828 Other elevated white blood cell count: Secondary | ICD-10-CM | POA: Diagnosis present

## 2022-08-17 DIAGNOSIS — D72829 Elevated white blood cell count, unspecified: Secondary | ICD-10-CM

## 2022-08-17 DIAGNOSIS — Z803 Family history of malignant neoplasm of breast: Secondary | ICD-10-CM | POA: Diagnosis not present

## 2022-08-17 NOTE — Patient Instructions (Signed)
[facility] **   You were seen today by Tarri Abernethy PA-C for your elevated white blood cells.   Your elevated white blood cells did NOT show any sign of being due to cancer or genetic mutation based on the labs we checked. I suspect that your high white blood cells are due to chronic inflammation.  This may be related to your intermittent joint pain.  We will check an additional lab today to see if you have any sign of autoimmune disease such as rheumatoid arthritis.  LABS: Return in 3 months for repeat blood counts  FOLLOW-UP APPOINTMENT: Labs in 6 months with PHONE visit 1 week after  ** Thank you for trusting me with your healthcare!  I strive to provide all of my patients with quality care at each visit.  If you receive a survey for this visit, I would be so grateful to you for taking the time to provide feedback.  Thank you in advance!  ~ Kamorah Nevils                   Dr. Derek Jack   &   Tarri Abernethy, PA-C   - - - - - - - - - - - - - - - - - -    Thank you for choosing [place] at [facility] to provide your oncology and hematology care.  To afford each patient quality time with our provider, please arrive at least 15 minutes before your scheduled appointment time.   If you have a lab appointment with the [facility] please come in thru the Main Entrance and check in at the main information desk.  You need to re-schedule your appointment should you arrive 10 or more minutes late.  We strive to give you quality time with our providers, and arriving late affects you and other patients whose appointments are after yours.  Also, if you no show three or more times for appointments you may be dismissed from the clinic at the providers discretion.     Again, thank you for choosing [facility].  Our hope is that these requests will decrease the amount of time  that you wait before being seen by our physicians.       _____________________________________________________________  Should you have questions after your visit to [facility], please contact our office at [phone] and follow the prompts.  Our office hours are 8:00 a.m. and 4:30 p.m. Monday - Friday.  Please note that voicemails left after 4:00 p.m. may not be returned until the following business day.  We are closed weekends and major holidays.  You do have access to a nurse 24-7, just call the main number to the clinic [phone] and do not press any options, hold on the line and a nurse will answer the phone.    For prescription refill requests, have your pharmacy contact our office and allow 72 hours.

## 2022-08-18 LAB — RHEUMATOID FACTOR: Rheumatoid fact SerPl-aCnc: <10 IU/mL (ref <14.0)

## 2022-09-09 DIAGNOSIS — M25551 Pain in right hip: Secondary | ICD-10-CM | POA: Diagnosis not present

## 2022-09-09 DIAGNOSIS — M25552 Pain in left hip: Secondary | ICD-10-CM | POA: Diagnosis not present

## 2022-09-09 DIAGNOSIS — E6609 Other obesity due to excess calories: Secondary | ICD-10-CM | POA: Diagnosis not present

## 2022-09-09 DIAGNOSIS — D72829 Elevated white blood cell count, unspecified: Secondary | ICD-10-CM | POA: Diagnosis not present

## 2022-09-09 DIAGNOSIS — M2550 Pain in unspecified joint: Secondary | ICD-10-CM | POA: Diagnosis not present

## 2022-09-09 DIAGNOSIS — Z6830 Body mass index (BMI) 30.0-30.9, adult: Secondary | ICD-10-CM | POA: Diagnosis not present

## 2022-09-09 DIAGNOSIS — M179 Osteoarthritis of knee, unspecified: Secondary | ICD-10-CM | POA: Diagnosis not present

## 2022-09-12 ENCOUNTER — Other Ambulatory Visit (HOSPITAL_COMMUNITY): Payer: Self-pay | Admitting: Family Medicine

## 2022-09-12 DIAGNOSIS — M179 Osteoarthritis of knee, unspecified: Secondary | ICD-10-CM

## 2022-09-12 DIAGNOSIS — E2839 Other primary ovarian failure: Secondary | ICD-10-CM

## 2022-09-15 ENCOUNTER — Ambulatory Visit (HOSPITAL_COMMUNITY)
Admission: RE | Admit: 2022-09-15 | Discharge: 2022-09-15 | Disposition: A | Discharge status: Home / Self Care | Payer: Medicare Other | Source: Ambulatory Visit | Attending: Family Medicine | Admitting: Family Medicine

## 2022-09-15 DIAGNOSIS — M810 Age-related osteoporosis without current pathological fracture: Secondary | ICD-10-CM | POA: Diagnosis not present

## 2022-09-15 DIAGNOSIS — E2839 Other primary ovarian failure: Secondary | ICD-10-CM | POA: Insufficient documentation

## 2022-09-15 DIAGNOSIS — M179 Osteoarthritis of knee, unspecified: Secondary | ICD-10-CM | POA: Diagnosis not present

## 2022-09-29 ENCOUNTER — Encounter: Payer: Self-pay | Admitting: Radiology

## 2022-11-02 DIAGNOSIS — M79641 Pain in right hand: Secondary | ICD-10-CM | POA: Diagnosis not present

## 2022-11-02 DIAGNOSIS — R5383 Other fatigue: Secondary | ICD-10-CM | POA: Diagnosis not present

## 2022-11-02 DIAGNOSIS — E669 Obesity, unspecified: Secondary | ICD-10-CM | POA: Diagnosis not present

## 2022-11-02 DIAGNOSIS — M79642 Pain in left hand: Secondary | ICD-10-CM | POA: Diagnosis not present

## 2022-11-02 DIAGNOSIS — R7982 Elevated C-reactive protein (CRP): Secondary | ICD-10-CM | POA: Diagnosis not present

## 2022-11-02 DIAGNOSIS — R700 Elevated erythrocyte sedimentation rate: Secondary | ICD-10-CM | POA: Diagnosis not present

## 2022-11-02 DIAGNOSIS — Z6831 Body mass index (BMI) 31.0-31.9, adult: Secondary | ICD-10-CM | POA: Diagnosis not present

## 2022-11-14 ENCOUNTER — Inpatient Hospital Stay: Payer: Medicare Other | Attending: Physician Assistant

## 2022-11-14 DIAGNOSIS — D72828 Other elevated white blood cell count: Secondary | ICD-10-CM | POA: Diagnosis not present

## 2022-11-14 DIAGNOSIS — D72829 Elevated white blood cell count, unspecified: Secondary | ICD-10-CM

## 2022-11-14 DIAGNOSIS — D72825 Bandemia: Secondary | ICD-10-CM

## 2022-11-14 LAB — CBC WITH DIFFERENTIAL/PLATELET
Abs Immature Granulocytes: 0.07 10*3/uL (ref 0.00–0.07)
Basophils Absolute: 0.1 10*3/uL (ref 0.0–0.1)
Basophils Relative: 0 %
Eosinophils Absolute: 0.2 10*3/uL (ref 0.0–0.5)
Eosinophils Relative: 1 %
HCT: 40.4 % (ref 36.0–46.0)
Hemoglobin: 12.7 g/dL (ref 12.0–15.0)
Immature Granulocytes: 1 %
Lymphocytes Relative: 13 %
Lymphs Abs: 1.9 10*3/uL (ref 0.7–4.0)
MCH: 28.7 pg (ref 26.0–34.0)
MCHC: 31.4 g/dL (ref 30.0–36.0)
MCV: 91.2 fL (ref 80.0–100.0)
Monocytes Absolute: 1 10*3/uL (ref 0.1–1.0)
Monocytes Relative: 7 %
Neutro Abs: 11.5 10*3/uL — ABNORMAL HIGH (ref 1.7–7.7)
Neutrophils Relative %: 78 %
Platelets: 370 10*3/uL (ref 150–400)
RBC: 4.43 MIL/uL (ref 3.87–5.11)
RDW: 13.7 % (ref 11.5–15.5)
WBC: 14.7 10*3/uL — ABNORMAL HIGH (ref 4.0–10.5)
nRBC: 0 % (ref 0.0–0.2)

## 2022-11-16 ENCOUNTER — Other Ambulatory Visit: Payer: Medicare Other

## 2023-01-05 DIAGNOSIS — H40013 Open angle with borderline findings, low risk, bilateral: Secondary | ICD-10-CM | POA: Diagnosis not present

## 2023-01-05 DIAGNOSIS — H35372 Puckering of macula, left eye: Secondary | ICD-10-CM | POA: Diagnosis not present

## 2023-01-05 DIAGNOSIS — H43812 Vitreous degeneration, left eye: Secondary | ICD-10-CM | POA: Diagnosis not present

## 2023-01-05 DIAGNOSIS — Z961 Presence of intraocular lens: Secondary | ICD-10-CM | POA: Diagnosis not present

## 2023-01-05 DIAGNOSIS — H25812 Combined forms of age-related cataract, left eye: Secondary | ICD-10-CM | POA: Diagnosis not present

## 2023-01-13 DIAGNOSIS — H25812 Combined forms of age-related cataract, left eye: Secondary | ICD-10-CM | POA: Diagnosis not present

## 2023-02-09 DIAGNOSIS — H43812 Vitreous degeneration, left eye: Secondary | ICD-10-CM | POA: Diagnosis not present

## 2023-02-09 DIAGNOSIS — H35372 Puckering of macula, left eye: Secondary | ICD-10-CM | POA: Diagnosis not present

## 2023-02-09 DIAGNOSIS — H40003 Preglaucoma, unspecified, bilateral: Secondary | ICD-10-CM | POA: Diagnosis not present

## 2023-02-09 DIAGNOSIS — Z961 Presence of intraocular lens: Secondary | ICD-10-CM | POA: Diagnosis not present

## 2023-02-17 ENCOUNTER — Inpatient Hospital Stay: Payer: Medicare Other | Attending: Physician Assistant

## 2023-02-17 DIAGNOSIS — D72829 Elevated white blood cell count, unspecified: Secondary | ICD-10-CM | POA: Insufficient documentation

## 2023-02-17 DIAGNOSIS — R700 Elevated erythrocyte sedimentation rate: Secondary | ICD-10-CM | POA: Insufficient documentation

## 2023-02-17 DIAGNOSIS — R7982 Elevated C-reactive protein (CRP): Secondary | ICD-10-CM | POA: Diagnosis not present

## 2023-02-17 DIAGNOSIS — D72828 Other elevated white blood cell count: Secondary | ICD-10-CM | POA: Diagnosis present

## 2023-02-17 DIAGNOSIS — M25552 Pain in left hip: Secondary | ICD-10-CM | POA: Diagnosis not present

## 2023-02-17 DIAGNOSIS — D72825 Bandemia: Secondary | ICD-10-CM

## 2023-02-17 DIAGNOSIS — M25551 Pain in right hip: Secondary | ICD-10-CM | POA: Diagnosis not present

## 2023-02-17 LAB — CBC WITH DIFFERENTIAL/PLATELET
Abs Immature Granulocytes: 0.03 10*3/uL (ref 0.00–0.07)
Basophils Absolute: 0.1 10*3/uL (ref 0.0–0.1)
Basophils Relative: 1 %
Eosinophils Absolute: 0.1 10*3/uL (ref 0.0–0.5)
Eosinophils Relative: 1 %
HCT: 42.2 % (ref 36.0–46.0)
Hemoglobin: 13.1 g/dL (ref 12.0–15.0)
Immature Granulocytes: 0 %
Lymphocytes Relative: 12 %
Lymphs Abs: 1.3 10*3/uL (ref 0.7–4.0)
MCH: 28.5 pg (ref 26.0–34.0)
MCHC: 31 g/dL (ref 30.0–36.0)
MCV: 91.9 fL (ref 80.0–100.0)
Monocytes Absolute: 0.8 10*3/uL (ref 0.1–1.0)
Monocytes Relative: 7 %
Neutro Abs: 8.7 10*3/uL — ABNORMAL HIGH (ref 1.7–7.7)
Neutrophils Relative %: 79 %
Platelets: 386 10*3/uL (ref 150–400)
RBC: 4.59 MIL/uL (ref 3.87–5.11)
RDW: 13.3 % (ref 11.5–15.5)
WBC: 11 10*3/uL — ABNORMAL HIGH (ref 4.0–10.5)
nRBC: 0 % (ref 0.0–0.2)

## 2023-02-17 LAB — C-REACTIVE PROTEIN: CRP: 1.2 mg/dL — ABNORMAL HIGH (ref <1.0)

## 2023-02-17 LAB — SEDIMENTATION RATE: Sed Rate: 45 mm/hr — ABNORMAL HIGH (ref 0–22)

## 2023-02-17 LAB — LACTATE DEHYDROGENASE: LDH: 121 U/L (ref 98–192)

## 2023-02-22 NOTE — Progress Notes (Signed)
VIRTUAL VISIT via TELEPHONE NOTE [facility]   I connected with Kaitlyn Glass  on 02/24/23 at  1:05 PM by telephone and verified that I am speaking with the correct person using two identifiers.  Location: Patient: Home Provider: [facility]   I discussed the limitations, risks, security and privacy concerns of performing an evaluation and management service by telephone and the availability of in person appointments. I also discussed with the patient that there may be a patient responsible charge related to this service. The patient expressed understanding and agreed to proceed.  REASON FOR VISIT: Leukocytosis  CURRENT THERAPY: Surveillance  INTERVAL HISTORY:  Kaitlyn Glass is contacted today for follow-up of her leukocytosis.  She was last seen by Rojelio Brenner PA-C on 08/17/2022.  At today's visit, she reports feeling fairly well.  She denies any changes in her baseline health status or symptoms since her last visit.  She continues to deny any fevers, night sweats, or weight loss.  She denies any new lumps or bumps.  She denies any recurrent infections.  She continues to have intermittent joint pain of varying locations.  Most recent steroid injection was Friday (02/17/2023), which she received after her most recent labs were drawn.  She has 75% energy and 100% appetite. She endorses that she is maintaining a stable weight.   REVIEW OF SYSTEMS:   Review of Systems  Constitutional:  Negative for chills, diaphoresis, fever, malaise/fatigue and weight loss.  Respiratory:  Positive for cough. Negative for shortness of breath.   Cardiovascular:  Negative for chest pain and palpitations.  Gastrointestinal:  Negative for abdominal pain, blood in stool, melena, nausea and vomiting.  Musculoskeletal:  Positive for joint pain.  Neurological:  Negative for dizziness and headaches.     PHYSICAL EXAM: (per limitations of virtual telephone visit)  The patient is  alert and oriented x 3, exhibiting adequate mentation, good mood, and ability to speak in full sentences and execute sound judgement.  ASSESSMENT & PLAN:  1.  Neutrophilic leukocytosis - Seen at the request of Dr. Phillips Odor - Review of labs on Epic shows WBC mildly elevated between 11.6-13.9 from July 2021 - No known history of autoimmune or inflammatory disease, but she does report intermittent pain in left elbow, right knee, and left ankle since she had COVID vaccines. - No prior history of splenectomy.  No recurrent infections.  No B symptoms. -- Rheumatology workup of arthralgia was negative. - Occasional oral steroids and/or steroid injection for joint pain.  Last given in July 2024 (after labs were drawn) - Lifelong non-smoker  - Hematology workup: Negative BCR ABL FISH Negative JAK2 with reflex to CALR and MPL Normal ANA.  Negative rheumatoid factor.  Mildly elevated CRP 1.3, elevated ESR 38.  Uric acid normal. - Most recent labs (02/17/2023): WBC 11.0/ANC 8.7, otherwise normal CBC.  LDH normal.  Elevated ESR at 45, elevated CRP 1.2. - No palpable lymphadenopathy or splenomegaly on exam at office visit in January 2024  - PLAN: Differential diagnosis favors reactive leukocytosis in the setting of suspected inflammation. - Labs only (CBC/D) in 6 months - Labs (CBC/D, CMP, LDH, ESR, CRP) and office visit in 1 year    2.  Social/family history: -Other PMH includes anxiety, benign tremor, GERD, headache, hyperlipidemia, and anxiety.  She has a history of brain aneurysm several decades ago. - She lives at home with her husband.  She is independent of ADLs and IADLs.  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  Non-smoker. - No family history of connective tissue disorders. - One brother had throat cancer.  Another brother had stomach cancer.  Maternal uncle had throat cancer.   PLAN SUMMARY: >> Labs only (CBC/D) in 6 months >> Labs in 1 year = CBC/D,  CMP, LDH, ESR, CRP >> OFFICE visit in 1 year (after labs)  ** Last office visit 08/17/2022     I discussed the assessment and treatment plan with the patient. The patient was provided an opportunity to ask questions and all were answered. The patient agreed with the plan and demonstrated an understanding of the instructions.   The patient was advised to call back or seek an in-person evaluation if the symptoms worsen or if the condition fails to improve as anticipated.  I provided 22 minutes of non-face-to-face time during this encounter.  Carnella Guadalajara, PA-C 02/24/23 1:20 PM

## 2023-02-24 ENCOUNTER — Inpatient Hospital Stay: Payer: Medicare Other | Admitting: Physician Assistant

## 2023-02-24 DIAGNOSIS — D72829 Elevated white blood cell count, unspecified: Secondary | ICD-10-CM | POA: Diagnosis not present

## 2023-03-16 ENCOUNTER — Other Ambulatory Visit (HOSPITAL_COMMUNITY): Payer: Self-pay | Admitting: Family Medicine

## 2023-03-16 DIAGNOSIS — Z1231 Encounter for screening mammogram for malignant neoplasm of breast: Secondary | ICD-10-CM

## 2023-04-10 ENCOUNTER — Ambulatory Visit (HOSPITAL_COMMUNITY)
Admission: RE | Admit: 2023-04-10 | Discharge: 2023-04-10 | Disposition: A | Discharge status: Home / Self Care | Payer: Medicare Other | Source: Ambulatory Visit | Attending: Family Medicine | Admitting: Family Medicine

## 2023-04-10 ENCOUNTER — Encounter (HOSPITAL_COMMUNITY): Payer: Self-pay

## 2023-04-10 DIAGNOSIS — Z1231 Encounter for screening mammogram for malignant neoplasm of breast: Secondary | ICD-10-CM | POA: Insufficient documentation

## 2023-05-19 DIAGNOSIS — D72829 Elevated white blood cell count, unspecified: Secondary | ICD-10-CM | POA: Diagnosis not present

## 2023-05-19 DIAGNOSIS — E782 Mixed hyperlipidemia: Secondary | ICD-10-CM | POA: Diagnosis not present

## 2023-05-19 DIAGNOSIS — Z0001 Encounter for general adult medical examination with abnormal findings: Secondary | ICD-10-CM | POA: Diagnosis not present

## 2023-05-19 DIAGNOSIS — Z1331 Encounter for screening for depression: Secondary | ICD-10-CM | POA: Diagnosis not present

## 2023-05-19 DIAGNOSIS — R7309 Other abnormal glucose: Secondary | ICD-10-CM | POA: Diagnosis not present

## 2023-05-19 DIAGNOSIS — Z23 Encounter for immunization: Secondary | ICD-10-CM | POA: Diagnosis not present

## 2023-05-19 DIAGNOSIS — Z6830 Body mass index (BMI) 30.0-30.9, adult: Secondary | ICD-10-CM | POA: Diagnosis not present

## 2023-05-19 DIAGNOSIS — E119 Type 2 diabetes mellitus without complications: Secondary | ICD-10-CM | POA: Diagnosis not present

## 2023-05-19 DIAGNOSIS — E7849 Other hyperlipidemia: Secondary | ICD-10-CM | POA: Diagnosis not present

## 2023-05-19 DIAGNOSIS — E559 Vitamin D deficiency, unspecified: Secondary | ICD-10-CM | POA: Diagnosis not present

## 2023-05-19 DIAGNOSIS — K219 Gastro-esophageal reflux disease without esophagitis: Secondary | ICD-10-CM | POA: Diagnosis not present

## 2023-05-19 DIAGNOSIS — E6609 Other obesity due to excess calories: Secondary | ICD-10-CM | POA: Diagnosis not present

## 2023-05-23 ENCOUNTER — Encounter: Payer: Self-pay | Admitting: *Deleted

## 2023-07-24 ENCOUNTER — Encounter (INDEPENDENT_AMBULATORY_CARE_PROVIDER_SITE_OTHER): Payer: Self-pay | Admitting: *Deleted

## 2023-08-11 DIAGNOSIS — H35372 Puckering of macula, left eye: Secondary | ICD-10-CM | POA: Diagnosis not present

## 2023-08-11 DIAGNOSIS — H26492 Other secondary cataract, left eye: Secondary | ICD-10-CM | POA: Diagnosis not present

## 2023-08-11 DIAGNOSIS — H3581 Retinal edema: Secondary | ICD-10-CM | POA: Diagnosis not present

## 2023-08-28 ENCOUNTER — Inpatient Hospital Stay: Payer: Medicare Other | Attending: Hematology

## 2023-08-28 DIAGNOSIS — D72829 Elevated white blood cell count, unspecified: Secondary | ICD-10-CM | POA: Diagnosis not present

## 2023-08-28 LAB — CBC WITH DIFFERENTIAL/PLATELET
Abs Immature Granulocytes: 0.05 10*3/uL (ref 0.00–0.07)
Basophils Absolute: 0 10*3/uL (ref 0.0–0.1)
Basophils Relative: 0 %
Eosinophils Absolute: 0.1 10*3/uL (ref 0.0–0.5)
Eosinophils Relative: 1 %
HCT: 40.8 % (ref 36.0–46.0)
Hemoglobin: 12.9 g/dL (ref 12.0–15.0)
Immature Granulocytes: 0 %
Lymphocytes Relative: 10 %
Lymphs Abs: 1.2 10*3/uL (ref 0.7–4.0)
MCH: 28.9 pg (ref 26.0–34.0)
MCHC: 31.6 g/dL (ref 30.0–36.0)
MCV: 91.5 fL (ref 80.0–100.0)
Monocytes Absolute: 0.9 10*3/uL (ref 0.1–1.0)
Monocytes Relative: 7 %
Neutro Abs: 9.8 10*3/uL — ABNORMAL HIGH (ref 1.7–7.7)
Neutrophils Relative %: 82 %
Platelets: 432 10*3/uL — ABNORMAL HIGH (ref 150–400)
RBC: 4.46 MIL/uL (ref 3.87–5.11)
RDW: 13.4 % (ref 11.5–15.5)
WBC: 12.1 10*3/uL — ABNORMAL HIGH (ref 4.0–10.5)
nRBC: 0 % (ref 0.0–0.2)

## 2023-09-14 DIAGNOSIS — H40013 Open angle with borderline findings, low risk, bilateral: Secondary | ICD-10-CM | POA: Diagnosis not present

## 2023-09-14 DIAGNOSIS — H35372 Puckering of macula, left eye: Secondary | ICD-10-CM | POA: Diagnosis not present

## 2023-09-14 DIAGNOSIS — Z961 Presence of intraocular lens: Secondary | ICD-10-CM | POA: Diagnosis not present

## 2023-09-14 DIAGNOSIS — H43812 Vitreous degeneration, left eye: Secondary | ICD-10-CM | POA: Diagnosis not present

## 2023-10-11 DIAGNOSIS — Z6830 Body mass index (BMI) 30.0-30.9, adult: Secondary | ICD-10-CM | POA: Diagnosis not present

## 2023-10-11 DIAGNOSIS — M5442 Lumbago with sciatica, left side: Secondary | ICD-10-CM | POA: Diagnosis not present

## 2023-10-11 DIAGNOSIS — E6609 Other obesity due to excess calories: Secondary | ICD-10-CM | POA: Diagnosis not present

## 2023-11-08 DIAGNOSIS — M5442 Lumbago with sciatica, left side: Secondary | ICD-10-CM | POA: Diagnosis not present

## 2023-11-08 DIAGNOSIS — M9903 Segmental and somatic dysfunction of lumbar region: Secondary | ICD-10-CM | POA: Diagnosis not present

## 2023-11-08 DIAGNOSIS — M9902 Segmental and somatic dysfunction of thoracic region: Secondary | ICD-10-CM | POA: Diagnosis not present

## 2023-11-08 DIAGNOSIS — M9905 Segmental and somatic dysfunction of pelvic region: Secondary | ICD-10-CM | POA: Diagnosis not present

## 2023-11-10 DIAGNOSIS — M9905 Segmental and somatic dysfunction of pelvic region: Secondary | ICD-10-CM | POA: Diagnosis not present

## 2023-11-10 DIAGNOSIS — M9902 Segmental and somatic dysfunction of thoracic region: Secondary | ICD-10-CM | POA: Diagnosis not present

## 2023-11-10 DIAGNOSIS — M5442 Lumbago with sciatica, left side: Secondary | ICD-10-CM | POA: Diagnosis not present

## 2023-11-10 DIAGNOSIS — M9903 Segmental and somatic dysfunction of lumbar region: Secondary | ICD-10-CM | POA: Diagnosis not present

## 2023-11-13 DIAGNOSIS — M5442 Lumbago with sciatica, left side: Secondary | ICD-10-CM | POA: Diagnosis not present

## 2023-11-13 DIAGNOSIS — M9905 Segmental and somatic dysfunction of pelvic region: Secondary | ICD-10-CM | POA: Diagnosis not present

## 2023-11-13 DIAGNOSIS — M9903 Segmental and somatic dysfunction of lumbar region: Secondary | ICD-10-CM | POA: Diagnosis not present

## 2023-11-13 DIAGNOSIS — M9902 Segmental and somatic dysfunction of thoracic region: Secondary | ICD-10-CM | POA: Diagnosis not present

## 2023-11-15 DIAGNOSIS — M9902 Segmental and somatic dysfunction of thoracic region: Secondary | ICD-10-CM | POA: Diagnosis not present

## 2023-11-15 DIAGNOSIS — M9905 Segmental and somatic dysfunction of pelvic region: Secondary | ICD-10-CM | POA: Diagnosis not present

## 2023-11-15 DIAGNOSIS — M5442 Lumbago with sciatica, left side: Secondary | ICD-10-CM | POA: Diagnosis not present

## 2023-11-15 DIAGNOSIS — M9903 Segmental and somatic dysfunction of lumbar region: Secondary | ICD-10-CM | POA: Diagnosis not present

## 2023-11-20 DIAGNOSIS — M5442 Lumbago with sciatica, left side: Secondary | ICD-10-CM | POA: Diagnosis not present

## 2023-11-20 DIAGNOSIS — M9902 Segmental and somatic dysfunction of thoracic region: Secondary | ICD-10-CM | POA: Diagnosis not present

## 2023-11-20 DIAGNOSIS — M9903 Segmental and somatic dysfunction of lumbar region: Secondary | ICD-10-CM | POA: Diagnosis not present

## 2023-11-20 DIAGNOSIS — M9905 Segmental and somatic dysfunction of pelvic region: Secondary | ICD-10-CM | POA: Diagnosis not present

## 2023-11-21 ENCOUNTER — Encounter: Payer: Self-pay | Admitting: *Deleted

## 2023-11-22 DIAGNOSIS — M9903 Segmental and somatic dysfunction of lumbar region: Secondary | ICD-10-CM | POA: Diagnosis not present

## 2023-11-22 DIAGNOSIS — M9905 Segmental and somatic dysfunction of pelvic region: Secondary | ICD-10-CM | POA: Diagnosis not present

## 2023-11-22 DIAGNOSIS — M9902 Segmental and somatic dysfunction of thoracic region: Secondary | ICD-10-CM | POA: Diagnosis not present

## 2023-11-22 DIAGNOSIS — M5442 Lumbago with sciatica, left side: Secondary | ICD-10-CM | POA: Diagnosis not present

## 2023-12-13 DIAGNOSIS — M9902 Segmental and somatic dysfunction of thoracic region: Secondary | ICD-10-CM | POA: Diagnosis not present

## 2023-12-13 DIAGNOSIS — M9905 Segmental and somatic dysfunction of pelvic region: Secondary | ICD-10-CM | POA: Diagnosis not present

## 2023-12-13 DIAGNOSIS — M9903 Segmental and somatic dysfunction of lumbar region: Secondary | ICD-10-CM | POA: Diagnosis not present

## 2023-12-13 DIAGNOSIS — M5442 Lumbago with sciatica, left side: Secondary | ICD-10-CM | POA: Diagnosis not present

## 2023-12-22 DIAGNOSIS — M9905 Segmental and somatic dysfunction of pelvic region: Secondary | ICD-10-CM | POA: Diagnosis not present

## 2023-12-22 DIAGNOSIS — M9903 Segmental and somatic dysfunction of lumbar region: Secondary | ICD-10-CM | POA: Diagnosis not present

## 2023-12-22 DIAGNOSIS — M9902 Segmental and somatic dysfunction of thoracic region: Secondary | ICD-10-CM | POA: Diagnosis not present

## 2023-12-22 DIAGNOSIS — M5442 Lumbago with sciatica, left side: Secondary | ICD-10-CM | POA: Diagnosis not present

## 2024-01-10 DIAGNOSIS — M5442 Lumbago with sciatica, left side: Secondary | ICD-10-CM | POA: Diagnosis not present

## 2024-01-10 DIAGNOSIS — M9902 Segmental and somatic dysfunction of thoracic region: Secondary | ICD-10-CM | POA: Diagnosis not present

## 2024-01-10 DIAGNOSIS — M9905 Segmental and somatic dysfunction of pelvic region: Secondary | ICD-10-CM | POA: Diagnosis not present

## 2024-01-10 DIAGNOSIS — M9903 Segmental and somatic dysfunction of lumbar region: Secondary | ICD-10-CM | POA: Diagnosis not present

## 2024-01-19 DIAGNOSIS — M5442 Lumbago with sciatica, left side: Secondary | ICD-10-CM | POA: Diagnosis not present

## 2024-01-19 DIAGNOSIS — M9905 Segmental and somatic dysfunction of pelvic region: Secondary | ICD-10-CM | POA: Diagnosis not present

## 2024-01-19 DIAGNOSIS — M9903 Segmental and somatic dysfunction of lumbar region: Secondary | ICD-10-CM | POA: Diagnosis not present

## 2024-01-19 DIAGNOSIS — M9902 Segmental and somatic dysfunction of thoracic region: Secondary | ICD-10-CM | POA: Diagnosis not present

## 2024-02-13 ENCOUNTER — Inpatient Hospital Stay: Attending: Hematology

## 2024-02-13 DIAGNOSIS — G8929 Other chronic pain: Secondary | ICD-10-CM | POA: Diagnosis not present

## 2024-02-13 DIAGNOSIS — R7982 Elevated C-reactive protein (CRP): Secondary | ICD-10-CM | POA: Diagnosis not present

## 2024-02-13 DIAGNOSIS — R700 Elevated erythrocyte sedimentation rate: Secondary | ICD-10-CM | POA: Diagnosis not present

## 2024-02-13 DIAGNOSIS — D72829 Elevated white blood cell count, unspecified: Secondary | ICD-10-CM

## 2024-02-13 DIAGNOSIS — D72825 Bandemia: Secondary | ICD-10-CM | POA: Insufficient documentation

## 2024-02-13 LAB — COMPREHENSIVE METABOLIC PANEL WITH GFR
ALT: 11 U/L (ref 0–44)
AST: 15 U/L (ref 15–41)
Albumin: 3.6 g/dL (ref 3.5–5.0)
Alkaline Phosphatase: 77 U/L (ref 38–126)
Anion gap: 11 (ref 5–15)
BUN: 18 mg/dL (ref 8–23)
CO2: 28 mmol/L (ref 22–32)
Calcium: 9.4 mg/dL (ref 8.9–10.3)
Chloride: 101 mmol/L (ref 98–111)
Creatinine, Ser: 0.74 mg/dL (ref 0.44–1.00)
GFR, Estimated: >60 mL/min (ref >60)
Glucose, Bld: 157 mg/dL — ABNORMAL HIGH (ref 70–99)
Potassium: 4.1 mmol/L (ref 3.5–5.1)
Sodium: 140 mmol/L (ref 135–145)
Total Bilirubin: 0.5 mg/dL (ref 0.0–1.2)
Total Protein: 7.1 g/dL (ref 6.5–8.1)

## 2024-02-13 LAB — CBC WITH DIFFERENTIAL/PLATELET
Abs Immature Granulocytes: 0.04 K/uL (ref 0.00–0.07)
Basophils Absolute: 0.1 K/uL (ref 0.0–0.1)
Basophils Relative: 0 %
Eosinophils Absolute: 0.2 K/uL (ref 0.0–0.5)
Eosinophils Relative: 1 %
HCT: 41.5 % (ref 36.0–46.0)
Hemoglobin: 12.9 g/dL (ref 12.0–15.0)
Immature Granulocytes: 0 %
Lymphocytes Relative: 10 %
Lymphs Abs: 1.3 K/uL (ref 0.7–4.0)
MCH: 28.3 pg (ref 26.0–34.0)
MCHC: 31.1 g/dL (ref 30.0–36.0)
MCV: 91 fL (ref 80.0–100.0)
Monocytes Absolute: 0.9 K/uL (ref 0.1–1.0)
Monocytes Relative: 7 %
Neutro Abs: 10.1 K/uL — ABNORMAL HIGH (ref 1.7–7.7)
Neutrophils Relative %: 82 %
Platelets: 439 K/uL — ABNORMAL HIGH (ref 150–400)
RBC: 4.56 MIL/uL (ref 3.87–5.11)
RDW: 13.5 % (ref 11.5–15.5)
WBC: 12.6 K/uL — ABNORMAL HIGH (ref 4.0–10.5)
nRBC: 0 % (ref 0.0–0.2)

## 2024-02-13 LAB — LACTATE DEHYDROGENASE: LDH: 126 U/L (ref 98–192)

## 2024-02-13 LAB — C-REACTIVE PROTEIN: CRP: 2.6 mg/dL — ABNORMAL HIGH (ref <1.0)

## 2024-02-13 LAB — SEDIMENTATION RATE: Sed Rate: 54 mm/h — ABNORMAL HIGH (ref 0–30)

## 2024-02-16 DIAGNOSIS — M9902 Segmental and somatic dysfunction of thoracic region: Secondary | ICD-10-CM | POA: Diagnosis not present

## 2024-02-16 DIAGNOSIS — M9903 Segmental and somatic dysfunction of lumbar region: Secondary | ICD-10-CM | POA: Diagnosis not present

## 2024-02-16 DIAGNOSIS — M5442 Lumbago with sciatica, left side: Secondary | ICD-10-CM | POA: Diagnosis not present

## 2024-02-16 DIAGNOSIS — M9905 Segmental and somatic dysfunction of pelvic region: Secondary | ICD-10-CM | POA: Diagnosis not present

## 2024-02-19 ENCOUNTER — Inpatient Hospital Stay: Payer: Medicare Other

## 2024-02-20 ENCOUNTER — Inpatient Hospital Stay (HOSPITAL_BASED_OUTPATIENT_CLINIC_OR_DEPARTMENT_OTHER): Admitting: Oncology

## 2024-02-20 ENCOUNTER — Encounter: Payer: Self-pay | Admitting: Oncology

## 2024-02-20 VITALS — Wt 168.7 lb

## 2024-02-20 DIAGNOSIS — D72825 Bandemia: Secondary | ICD-10-CM

## 2024-02-20 DIAGNOSIS — G8929 Other chronic pain: Secondary | ICD-10-CM | POA: Diagnosis not present

## 2024-02-20 DIAGNOSIS — R7982 Elevated C-reactive protein (CRP): Secondary | ICD-10-CM | POA: Diagnosis not present

## 2024-02-20 DIAGNOSIS — R700 Elevated erythrocyte sedimentation rate: Secondary | ICD-10-CM | POA: Diagnosis not present

## 2024-02-20 NOTE — Assessment & Plan Note (Addendum)
-   Seen at the request of Dr. Marvine - Review of labs on Epic shows WBC mildly elevated between 11.6-13.9 from July 2021 - No known history of autoimmune or inflammatory disease, but she does report intermittent pain in left elbow, right knee, and left ankle since she had COVID vaccines. - No prior history of splenectomy.  No recurrent infections.  No B symptoms. -- Rheumatology workup of arthralgia was negative. - Occasional oral steroids and/or steroid injection for joint pain.  - Lifelong non-smoker  - Hematology workup: Negative BCR ABL FISH Negative JAK2 with reflex to CALR and MPL Normal ANA.  Negative rheumatoid factor.  Mildly elevated CRP 1.3, elevated ESR 38.  Uric acid normal. - No palpable lymphadenopathy or splenomegaly on exam.  - Differential diagnosis favors reactive leukocytosis in the setting of suspected inflammation. -We discussed no additional follow-up as she is followed by her PCP annually.  Lab work has essentially been unremarkable except for chronic stable inflammation.

## 2024-02-20 NOTE — Progress Notes (Signed)
 [facility] OFFICE PROGRESS NOTE  Marvine Rush, MD  ASSESSMENT & PLAN:  Assessment & Plan [place] - Seen at the request of Dr. Marvine - Review of labs on Epic shows WBC mildly elevated between 11.6-13.9 from July 2021 - No known history of autoimmune or inflammatory disease, but she does report intermittent pain in left elbow, right knee, and left ankle since she had COVID vaccines. - No prior history of splenectomy.  No recurrent infections.  No B symptoms. -- Rheumatology workup of arthralgia was negative. - Occasional oral steroids and/or steroid injection for joint pain.  - Lifelong non-smoker  - Hematology workup: Negative BCR ABL FISH Negative JAK2 with reflex to CALR and MPL Normal ANA.  Negative rheumatoid factor.  Mildly elevated CRP 1.3, elevated ESR 38.  Uric acid normal. - No palpable lymphadenopathy or splenomegaly on exam.  - Differential diagnosis favors reactive leukocytosis in the setting of suspected inflammation. -We discussed no additional follow-up as she is followed by her PCP annually.  Lab work has essentially been unremarkable except for chronic stable inflammation.    No orders of the defined types were placed in this encounter.   INTERVAL HISTORY: Patient returns for stent leukocytosis.  Reports she has had persistently elevated white count, chronic joint pain that rotates from location and severity each day and fatigue since she received the COVID-vaccine back in 2020.  Thinks the COVID-vaccine caused all of her problems.  She has been seen by rheumatology, pulmonology and hematology and no one can find anything wrong with her.  Reports she does think it is slowly getting better although occasionally it is debilitating.  She is able to do all of her activities of daily living but has trouble lifting heavy items.  Denies any recent hospitalizations or changes to her baseline health.  We reviewed her CBC, CMP, LDH, ESR and CRP.  SUMMARY  OF HEMATOLOGIC HISTORY: Patient is a 73 year old female with past medical history significant for aneurysm that did not require intervention, anxiety, tremor, GERD, headache and high cholesterol.  CBC    Component Value Date/Time   WBC 12.6 (H) 02/13/2024 1032   RBC 4.56 02/13/2024 1032   HGB 12.9 02/13/2024 1032   HGB 14.2 09/27/2021 1137   HCT 41.5 02/13/2024 1032   HCT 43.7 09/27/2021 1137   PLT 439 (H) 02/13/2024 1032   PLT 410 09/27/2021 1137   MCV 91.0 02/13/2024 1032   MCV 89 09/27/2021 1137   MCH 28.3 02/13/2024 1032   MCHC 31.1 02/13/2024 1032   RDW 13.5 02/13/2024 1032   RDW 12.2 09/27/2021 1137   LYMPHSABS 1.3 02/13/2024 1032   LYMPHSABS 2.2 09/27/2021 1137   MONOABS 0.9 02/13/2024 1032   EOSABS 0.2 02/13/2024 1032   EOSABS 0.1 09/27/2021 1137   BASOSABS 0.1 02/13/2024 1032   BASOSABS 0.1 09/27/2021 1137      Latest Ref Rng & Units 02/13/2024   10:32 AM 09/27/2021   11:37 AM  CMP  Glucose 70 - 99 mg/dL 842  74   BUN 8 - 23 mg/dL 18  17   Creatinine 9.55 - 1.00 mg/dL 9.25  9.28   Sodium 864 - 145 mmol/L 140  145   Potassium 3.5 - 5.1 mmol/L 4.1  5.2   Chloride 98 - 111 mmol/L 101  102   CO2 22 - 32 mmol/L 28  27   Calcium 8.9 - 10.3 mg/dL 9.4  89.6   Total Protein 6.5 - 8.1 g/dL 7.1  6.9   Total Bilirubin 0.0 - 1.2 mg/dL 0.5  0.3   Alkaline Phos 38 - 126 U/L 77  104   AST 15 - 41 U/L 15  15   ALT 0 - 44 U/L 11  14      There were no vitals filed for this visit.  Review of Systems  Constitutional:  Positive for malaise/fatigue.  Musculoskeletal:  Positive for joint pain and myalgias.  Neurological:  Positive for tremors.   Physical Exam Vitals reviewed.  Constitutional:      Appearance: Normal appearance. She is obese.  Neurological:     Mental Status: She is alert and oriented to person, place, and time.     I spent 25 minutes dedicated to the care of this patient (face-to-face and non-face-to-face) on the date of the encounter to include what  is described in the assessment and plan.,  Delon Hope, NP 02/20/2024 2:56 PM

## 2024-02-23 DIAGNOSIS — H35372 Puckering of macula, left eye: Secondary | ICD-10-CM | POA: Diagnosis not present

## 2024-02-23 DIAGNOSIS — H26492 Other secondary cataract, left eye: Secondary | ICD-10-CM | POA: Diagnosis not present

## 2024-02-23 DIAGNOSIS — H43812 Vitreous degeneration, left eye: Secondary | ICD-10-CM | POA: Diagnosis not present

## 2024-02-23 DIAGNOSIS — H3581 Retinal edema: Secondary | ICD-10-CM | POA: Diagnosis not present

## 2024-02-26 ENCOUNTER — Inpatient Hospital Stay: Payer: Medicare Other | Admitting: Physician Assistant

## 2024-03-01 DIAGNOSIS — M5442 Lumbago with sciatica, left side: Secondary | ICD-10-CM | POA: Diagnosis not present

## 2024-03-01 DIAGNOSIS — M9903 Segmental and somatic dysfunction of lumbar region: Secondary | ICD-10-CM | POA: Diagnosis not present

## 2024-03-01 DIAGNOSIS — M9905 Segmental and somatic dysfunction of pelvic region: Secondary | ICD-10-CM | POA: Diagnosis not present

## 2024-03-01 DIAGNOSIS — M9902 Segmental and somatic dysfunction of thoracic region: Secondary | ICD-10-CM | POA: Diagnosis not present

## 2024-03-22 ENCOUNTER — Encounter: Payer: Self-pay | Admitting: Radiology

## 2024-03-29 DIAGNOSIS — M9905 Segmental and somatic dysfunction of pelvic region: Secondary | ICD-10-CM | POA: Diagnosis not present

## 2024-03-29 DIAGNOSIS — M9903 Segmental and somatic dysfunction of lumbar region: Secondary | ICD-10-CM | POA: Diagnosis not present

## 2024-03-29 DIAGNOSIS — M5442 Lumbago with sciatica, left side: Secondary | ICD-10-CM | POA: Diagnosis not present

## 2024-03-29 DIAGNOSIS — M9902 Segmental and somatic dysfunction of thoracic region: Secondary | ICD-10-CM | POA: Diagnosis not present

## 2024-04-08 ENCOUNTER — Other Ambulatory Visit (HOSPITAL_COMMUNITY): Payer: Self-pay | Admitting: Family Medicine

## 2024-04-08 DIAGNOSIS — Z1231 Encounter for screening mammogram for malignant neoplasm of breast: Secondary | ICD-10-CM

## 2024-04-10 ENCOUNTER — Ambulatory Visit (HOSPITAL_COMMUNITY)
Admission: RE | Admit: 2024-04-10 | Discharge: 2024-04-10 | Disposition: A | Discharge status: Home / Self Care | Source: Ambulatory Visit | Attending: Family Medicine | Admitting: Family Medicine

## 2024-04-10 DIAGNOSIS — Z1231 Encounter for screening mammogram for malignant neoplasm of breast: Secondary | ICD-10-CM | POA: Diagnosis not present

## 2024-04-15 ENCOUNTER — Ambulatory Visit (HOSPITAL_COMMUNITY)

## 2024-05-10 DIAGNOSIS — M9902 Segmental and somatic dysfunction of thoracic region: Secondary | ICD-10-CM | POA: Diagnosis not present

## 2024-05-10 DIAGNOSIS — M9903 Segmental and somatic dysfunction of lumbar region: Secondary | ICD-10-CM | POA: Diagnosis not present

## 2024-05-10 DIAGNOSIS — M5442 Lumbago with sciatica, left side: Secondary | ICD-10-CM | POA: Diagnosis not present

## 2024-05-10 DIAGNOSIS — M9905 Segmental and somatic dysfunction of pelvic region: Secondary | ICD-10-CM | POA: Diagnosis not present

## 2024-05-30 DIAGNOSIS — K219 Gastro-esophageal reflux disease without esophagitis: Secondary | ICD-10-CM | POA: Diagnosis not present

## 2024-05-30 DIAGNOSIS — J45998 Other asthma: Secondary | ICD-10-CM | POA: Diagnosis not present

## 2024-05-30 DIAGNOSIS — Z0000 Encounter for general adult medical examination without abnormal findings: Secondary | ICD-10-CM | POA: Diagnosis not present

## 2024-05-30 DIAGNOSIS — E559 Vitamin D deficiency, unspecified: Secondary | ICD-10-CM | POA: Diagnosis not present

## 2024-05-30 DIAGNOSIS — F411 Generalized anxiety disorder: Secondary | ICD-10-CM | POA: Diagnosis not present

## 2024-05-30 DIAGNOSIS — E785 Hyperlipidemia, unspecified: Secondary | ICD-10-CM | POA: Diagnosis not present

## 2024-05-30 DIAGNOSIS — E119 Type 2 diabetes mellitus without complications: Secondary | ICD-10-CM | POA: Diagnosis not present

## 2024-05-30 DIAGNOSIS — D72829 Elevated white blood cell count, unspecified: Secondary | ICD-10-CM | POA: Diagnosis not present

## 2024-05-30 DIAGNOSIS — G250 Essential tremor: Secondary | ICD-10-CM | POA: Diagnosis not present

## 2024-05-30 DIAGNOSIS — Z23 Encounter for immunization: Secondary | ICD-10-CM | POA: Diagnosis not present

## 2024-06-03 ENCOUNTER — Encounter: Payer: Self-pay | Admitting: Radiology

## 2024-06-07 DIAGNOSIS — M9902 Segmental and somatic dysfunction of thoracic region: Secondary | ICD-10-CM | POA: Diagnosis not present

## 2024-06-07 DIAGNOSIS — M9905 Segmental and somatic dysfunction of pelvic region: Secondary | ICD-10-CM | POA: Diagnosis not present

## 2024-06-07 DIAGNOSIS — M9903 Segmental and somatic dysfunction of lumbar region: Secondary | ICD-10-CM | POA: Diagnosis not present

## 2024-06-07 DIAGNOSIS — M5442 Lumbago with sciatica, left side: Secondary | ICD-10-CM | POA: Diagnosis not present

## 2024-07-05 DIAGNOSIS — M9903 Segmental and somatic dysfunction of lumbar region: Secondary | ICD-10-CM | POA: Diagnosis not present

## 2024-07-05 DIAGNOSIS — M5442 Lumbago with sciatica, left side: Secondary | ICD-10-CM | POA: Diagnosis not present

## 2024-07-05 DIAGNOSIS — M9902 Segmental and somatic dysfunction of thoracic region: Secondary | ICD-10-CM | POA: Diagnosis not present

## 2024-07-05 DIAGNOSIS — M9905 Segmental and somatic dysfunction of pelvic region: Secondary | ICD-10-CM | POA: Diagnosis not present
